# Patient Record
Sex: Male | Born: 1975 | Race: Black or African American | Marital: Married | Smoking: Former smoker
Health system: Southern US, Community
[De-identification: ages and names within clinical notes are randomized; demographics above are authoritative.]

## PROBLEM LIST (undated history)

## (undated) DIAGNOSIS — I639 Cerebral infarction, unspecified: Secondary | ICD-10-CM

## (undated) DIAGNOSIS — E119 Type 2 diabetes mellitus without complications: Secondary | ICD-10-CM

## (undated) HISTORY — PX: WRIST FRACTURE SURGERY: SHX121

## (undated) HISTORY — DX: Type 2 diabetes mellitus without complications: E11.9

## (undated) HISTORY — DX: Cerebral infarction, unspecified: I63.9

---

## 2005-04-07 ENCOUNTER — Emergency Department: Payer: Self-pay | Admitting: Emergency Medicine

## 2005-04-13 ENCOUNTER — Ambulatory Visit: Payer: Self-pay | Admitting: Unknown Physician Specialty

## 2006-08-17 IMAGING — CR DG WRIST COMPLETE 3+V*R*
1 series · 4 of 4 positions shown · non-contrast
Comparison: none

REASON FOR EXAM: injury  pain in wrist   deformity noted   toom 1
COMMENTS:

[Series 1: view not recorded · 0.17mm/px · 4 of 4 slices shown]
[im 1/4]
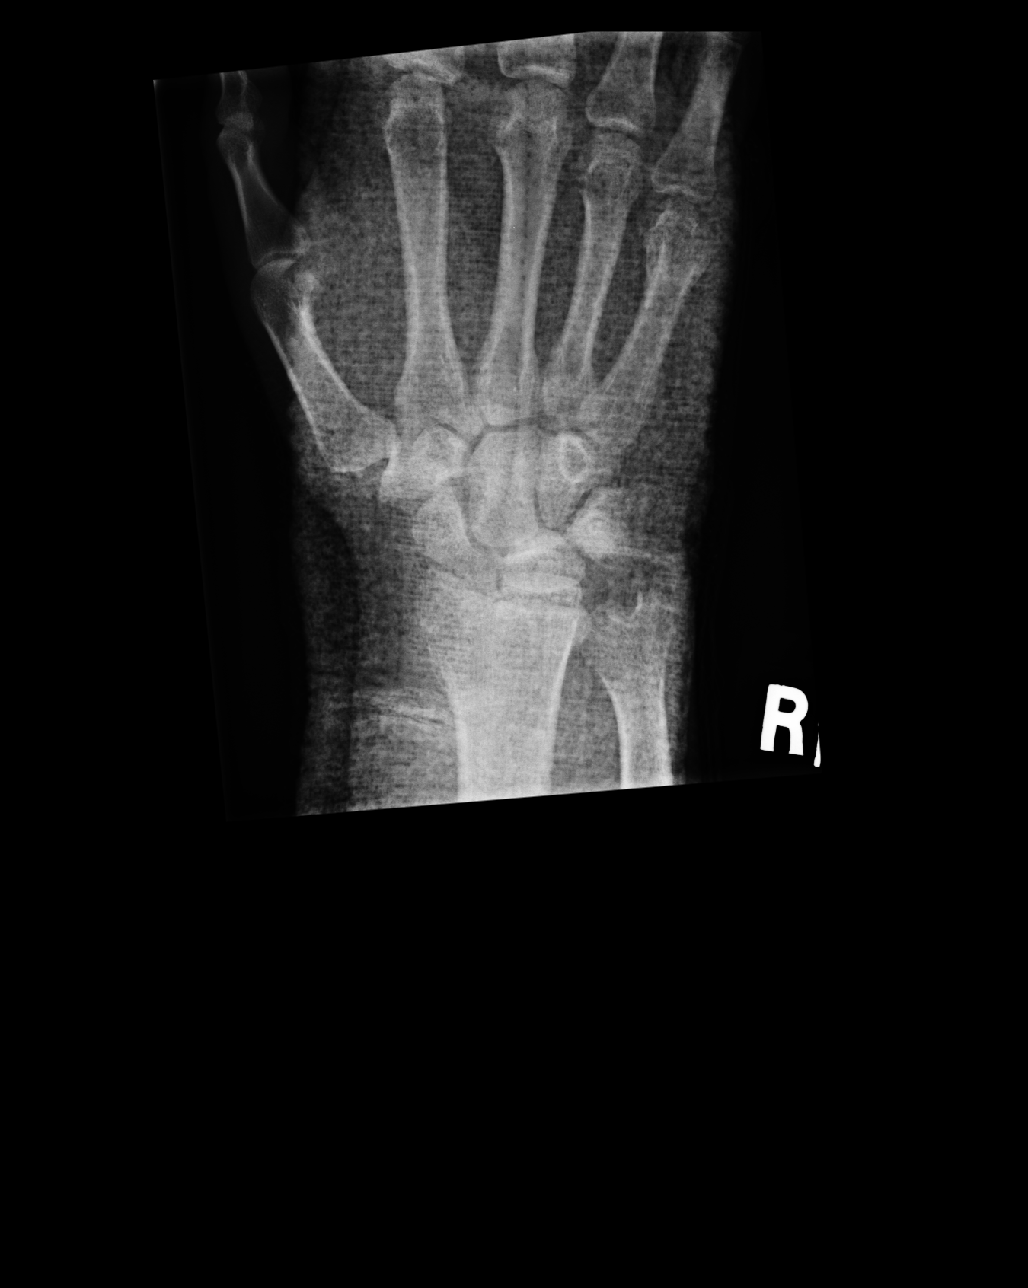
[im 2/4]
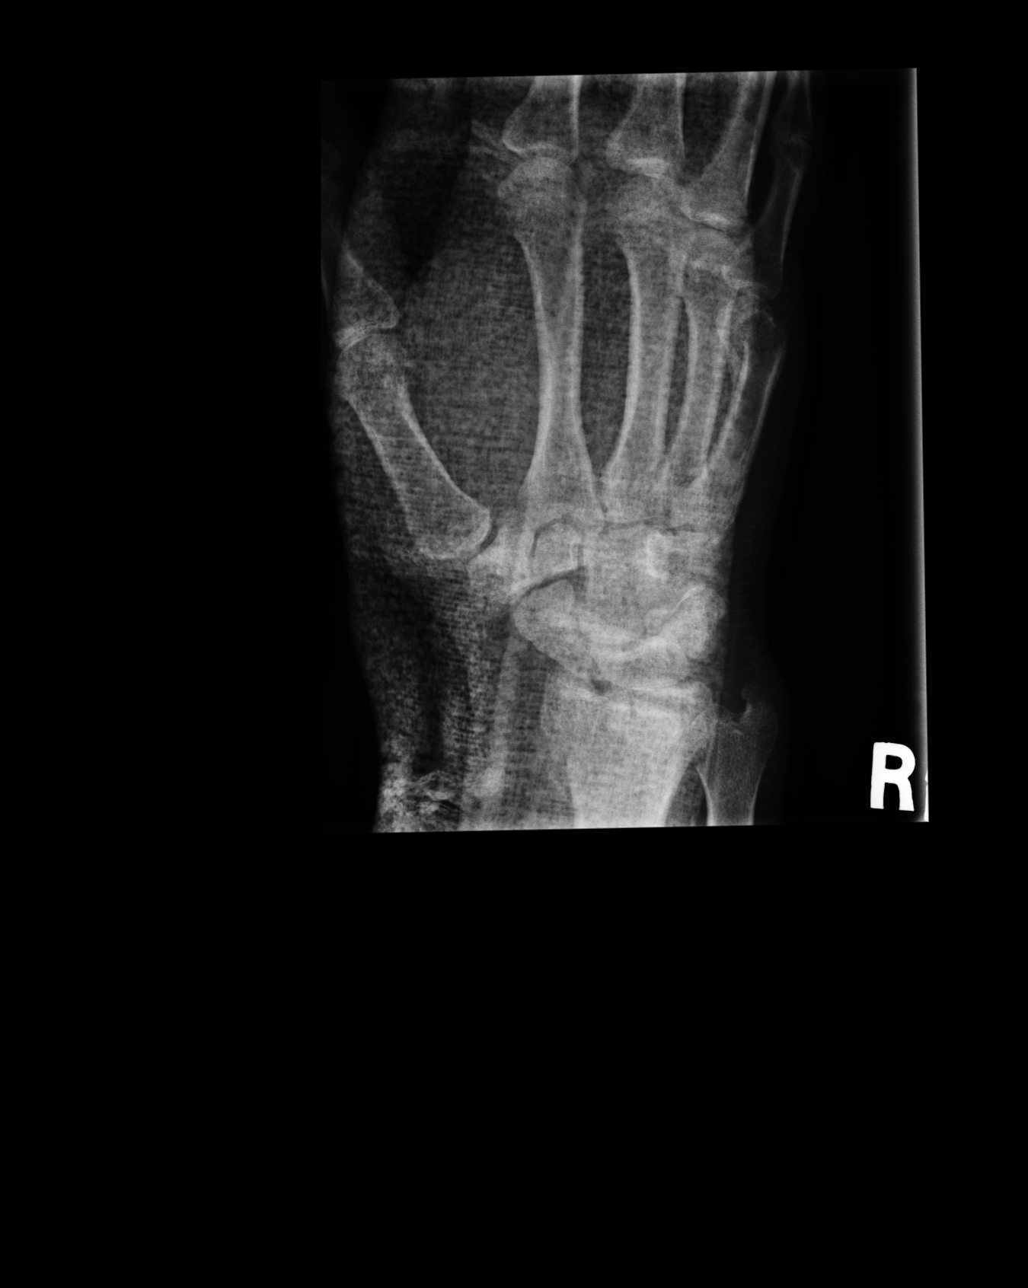
[im 3/4]
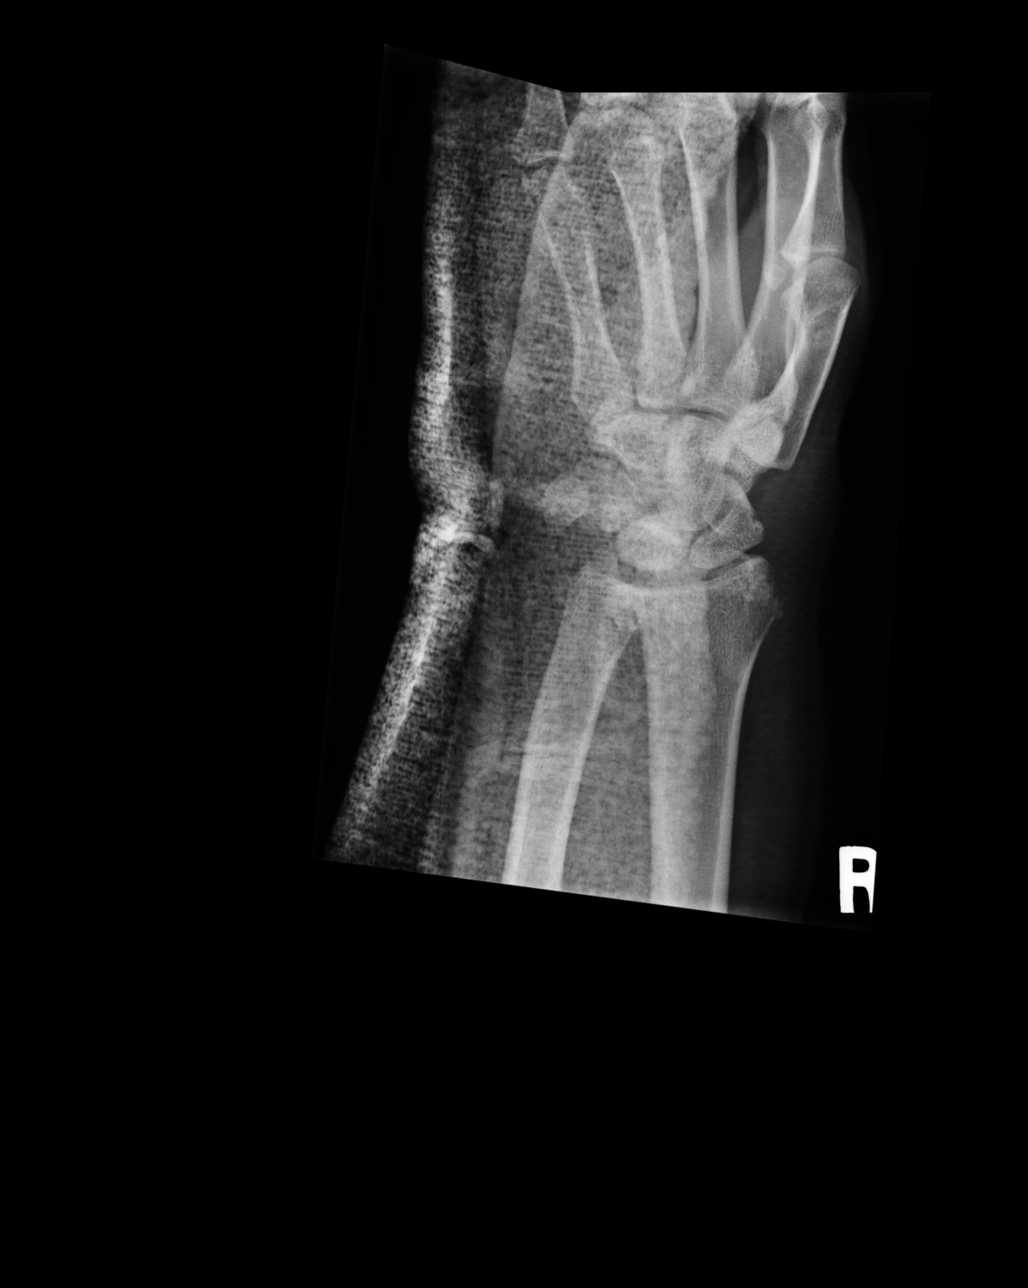
[im 4/4]
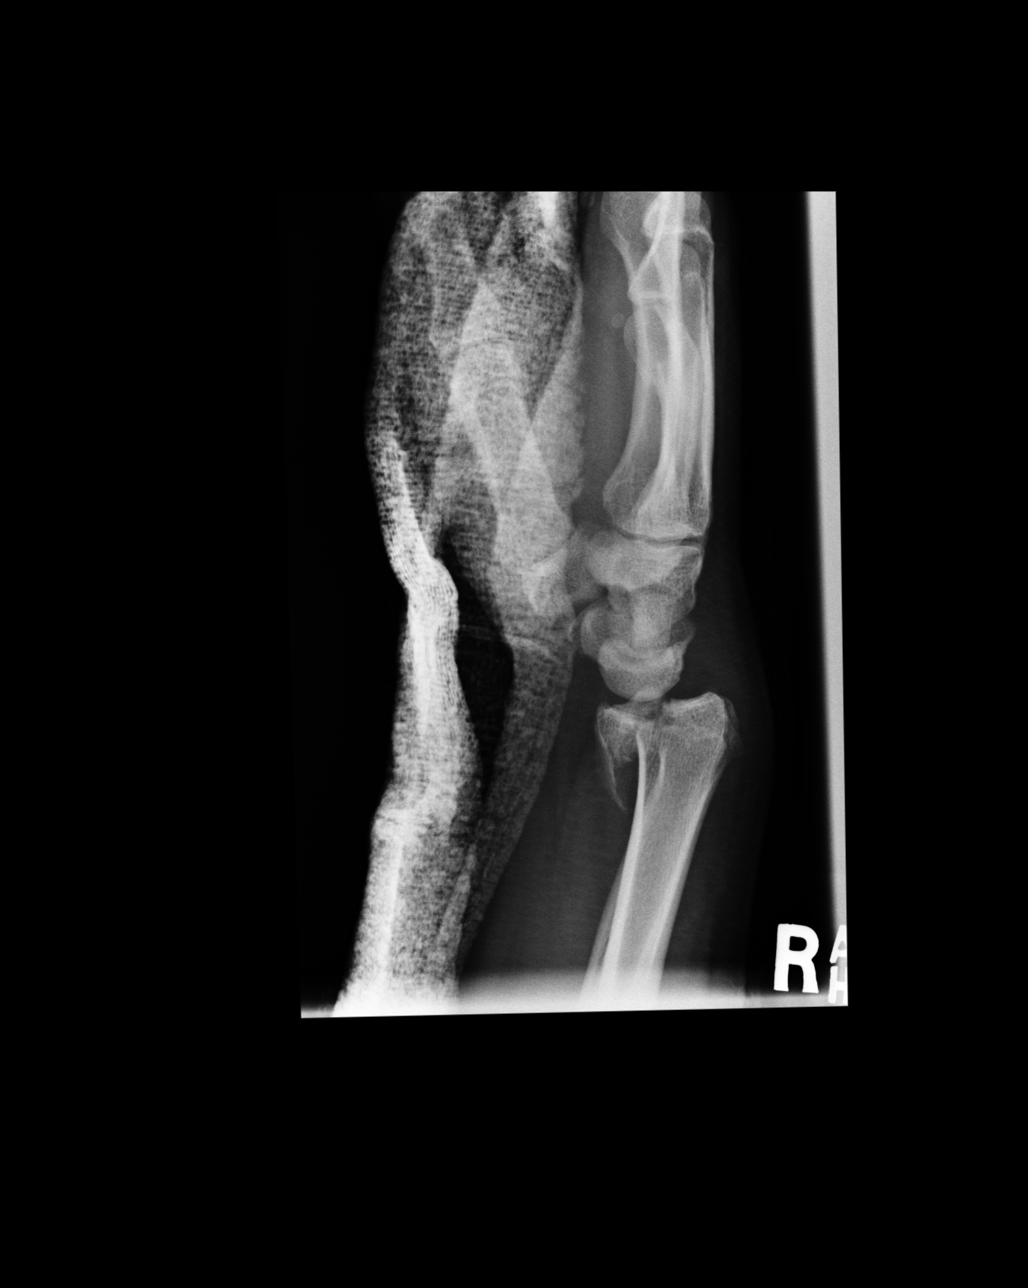

[4 of 4 positions shown; findings below may reference images not displayed]

PROCEDURE:     DXR - DXR WRIST RT COMP WITH OBLIQUES  - April 07, 2005  [DATE]

RESULT:     The patient is in a volar splint. There is a fracture of the
volar aspect of the distal radial epiphysis.  Other associated fractures may
be present, but would be difficult to discern due to the overlying splint.
We can re-image without the splint as needed.  The fracture is displaced and
comminuted.
IMPRESSION: 1)See above.

## 2018-03-05 ENCOUNTER — Other Ambulatory Visit: Payer: Self-pay

## 2018-03-05 ENCOUNTER — Ambulatory Visit (INDEPENDENT_AMBULATORY_CARE_PROVIDER_SITE_OTHER): Payer: BLUE CROSS/BLUE SHIELD | Admitting: Family Medicine

## 2018-03-05 ENCOUNTER — Encounter: Payer: Self-pay | Admitting: Family Medicine

## 2018-03-05 VITALS — BP 117/67 | HR 54 | Temp 98.4°F | Ht 72.0 in | Wt 212.3 lb

## 2018-03-05 DIAGNOSIS — Z23 Encounter for immunization: Secondary | ICD-10-CM

## 2018-03-05 DIAGNOSIS — Z7689 Persons encountering health services in other specified circumstances: Secondary | ICD-10-CM

## 2018-03-05 DIAGNOSIS — Z114 Encounter for screening for human immunodeficiency virus [HIV]: Secondary | ICD-10-CM | POA: Diagnosis not present

## 2018-03-05 DIAGNOSIS — Z113 Encounter for screening for infections with a predominantly sexual mode of transmission: Secondary | ICD-10-CM | POA: Diagnosis not present

## 2018-03-05 DIAGNOSIS — Z0000 Encounter for general adult medical examination without abnormal findings: Secondary | ICD-10-CM | POA: Diagnosis not present

## 2018-03-05 LAB — UA/M W/RFLX CULTURE, ROUTINE
BILIRUBIN UA: NEGATIVE
GLUCOSE, UA: NEGATIVE
KETONES UA: NEGATIVE
Leukocytes, UA: NEGATIVE
Nitrite, UA: NEGATIVE
PROTEIN UA: NEGATIVE
RBC UA: NEGATIVE
SPEC GRAV UA: 1.01 (ref 1.005–1.030)
UUROB: 0.2 mg/dL (ref 0.2–1.0)
pH, UA: 6.5 (ref 5.0–7.5)

## 2018-03-05 NOTE — Progress Notes (Signed)
BP 117/67   Pulse (!) 54   Temp 98.4 F (36.9 C) (Oral)   Ht 6' (1.829 m)   Wt 212 lb 4.8 oz (96.3 kg)   SpO2 99%   BMI 28.79 kg/m    Subjective:    Patient ID: Javier Johnson, male    DOB: February 08, 1976, 42 y.o.   MRN: [id number]  HPI: Javier Johnson is a 42 y.o. male presenting on 03/05/2018 for comprehensive medical examination. Current medical complaints include:see below  Here today to establish care and have a CPE. No known medical problems and not currently on any medications. No concerns today.   Depression Screen done today and results listed below:  [facility] 03/05/2018  Decreased Interest 1  Down, Depressed, Hopeless 1  PHQ - 2 Score 2  Altered sleeping 0  Tired, decreased energy 1  Change in appetite 0  Feeling bad or failure about yourself  1  Trouble concentrating 0  Moving slowly or fidgety/restless 0  Suicidal thoughts 0  PHQ-9 Score 4    The patient does not have a history of falls. I did not complete a risk assessment for falls. A plan of care for falls was not documented.   Past Medical History:  Past Medical History:  Diagnosis Date  . Diabetes mellitus without complication (HCC)   . Stroke [facility](HCC)     Surgical History:  Past Surgical History:  Procedure Laterality Date  . WRIST FRACTURE SURGERY      Medications:  No current outpatient medications on file prior to visit.   No current facility-administered medications on file prior to visit.     Allergies:  Not on File  Social History:  Social History   Socioeconomic History  . Marital status: Unknown    Spouse name: Not on file  . Number of children: Not on file  . Years of education: Not on file  . Highest education level: Not on file  Occupational History  . Not on file  Social Needs  . Financial resource strain: Not on file  . Food insecurity:    Worry: Not on file    Inability: Not on file  . Transportation needs:    Medical: Not on file    Non-medical:  Not on file  Tobacco Use  . Smoking status: Former Games developermoker  . Smokeless tobacco: Never Used  Substance and Sexual Activity  . Alcohol use: Yes    Alcohol/week: 12.0 standard drinks    Types: 12 Shots of liquor per week  . Drug use: Not Currently  . Sexual activity: Yes  Lifestyle  . Physical activity:    Days per week: Not on file    Minutes per session: Not on file  . Stress: Not on file  Relationships  . Social connections:    Talks on phone: Not on file    Gets together: Not on file    Attends religious service: Not on file    Active member of club or organization: Not on file    Attends meetings of clubs or organizations: Not on file    Relationship status: Not on file  . Intimate partner violence:    Fear of current or ex partner: Not on file    Emotionally abused: Not on file    Physically abused: Not on file    Forced sexual activity: Not on file  Other Topics Concern  . Not on file  Social History Narrative  . Not on file  Social History   Tobacco Use  Smoking Status Former Smoker  Smokeless Tobacco Never Used   Social History   Substance and Sexual Activity  Alcohol Use Yes  . Alcohol/week: 12.0 standard drinks  . Types: 12 Shots of liquor per week    Family History:  Family History  Problem Relation Age of Onset  . Diabetes Maternal Grandmother   . Stroke Paternal Grandfather     Past medical history, surgical history, medications, allergies, family history and social history reviewed with patient today and changes made to appropriate areas of the chart.   Review of Systems - General ROS: negative Psychological ROS: negative Ophthalmic ROS: negative ENT ROS: negative Allergy and Immunology ROS: negative Respiratory ROS: no cough, shortness of breath, or wheezing Cardiovascular ROS: no chest pain or dyspnea on exertion Gastrointestinal ROS: no abdominal pain, change in bowel habits, or black or bloody stools Genito-Urinary ROS: no dysuria,  trouble voiding, or hematuria Musculoskeletal ROS: negative Neurological ROS: no TIA or stroke symptoms Dermatological ROS: negative All other ROS negative except what is listed above and in the HPI.      Objective:    BP 117/67   Pulse (!) 54   Temp 98.4 F (36.9 C) (Oral)   Ht 6' (1.829 m)   Wt 212 lb 4.8 oz (96.3 kg)   SpO2 99%   BMI 28.79 kg/m   Wt Readings from Last 3 Encounters:  03/05/18 212 lb 4.8 oz (96.3 kg)    Physical Exam  Constitutional: He is oriented to person, place, and time. He appears well-developed and well-nourished. No distress.  HENT:  Head: Atraumatic.  Right Ear: External ear normal.  Left Ear: External ear normal.  Nose: Nose normal.  Mouth/Throat: Oropharynx is clear and moist.  Eyes: Pupils are equal, round, and reactive to light. Conjunctivae are normal. No scleral icterus.  Neck: Normal range of motion. Neck supple.  Cardiovascular: Normal rate, regular rhythm, normal heart sounds and intact distal pulses.  No murmur heard. Pulmonary/Chest: Effort normal and breath sounds normal. No respiratory distress.  Abdominal: Soft. Bowel sounds are normal. He exhibits no distension and no mass. There is no tenderness. There is no guarding.  Musculoskeletal: Normal range of motion. He exhibits no edema or tenderness.  Neurological: He is alert and oriented to person, place, and time. He has normal reflexes.  Skin: Skin is warm and dry. No rash noted.  Psychiatric: He has a normal mood and affect. His behavior is normal.  Nursing note and vitals reviewed.   Results for orders placed or performed in visit on 03/05/18  HIV antibody  Result Value Ref Range   HIV Screen 4th Generation wRfx Non Reactive Non Reactive  CBC with Differential/Platelet  Result Value Ref Range   WBC 9.4 3.4 - 10.8 x10E3/uL   RBC 4.24 4.14 - 5.80 x10E6/uL   Hemoglobin 13.3 13.0 - 17.7 g/dL   Hematocrit 96.0 45.4 - 51.0 %   MCV 95 79 - 97 fL   MCH 31.4 26.6 - 33.0 pg   MCHC  33.2 31.5 - 35.7 g/dL   RDW 09.8 (L) 11.9 - 14.7 %   Platelets 305 150 - 450 x10E3/uL   Neutrophils 80 Not Estab. %   Lymphs 11 Not Estab. %   Monocytes 8 Not Estab. %   Eos 1 Not Estab. %   Basos 0 Not Estab. %   Neutrophils Absolute 7.5 (H) 1.4 - 7.0 x10E3/uL   Lymphocytes Absolute 1.1 0.7 - 3.1  x10E3/uL   Monocytes Absolute 0.8 0.1 - 0.9 x10E3/uL   EOS (ABSOLUTE) 0.1 0.0 - 0.4 x10E3/uL   Basophils Absolute 0.0 0.0 - 0.2 x10E3/uL   Immature Granulocytes 0 Not Estab. %   Immature Grans (Abs) 0.0 0.0 - 0.1 x10E3/uL  Comprehensive metabolic panel  Result Value Ref Range   Glucose 54 (L) 65 - 99 mg/dL   BUN 14 6 - 24 mg/dL   Creatinine, Ser 1.61 (H) 0.76 - 1.27 mg/dL   GFR calc non Af Amer 69 >59 mL/min/1.73   GFR calc Af Amer 80 >59 mL/min/1.73   BUN/Creatinine Ratio 11 9 - 20   Sodium 139 134 - 144 mmol/L   Potassium 3.5 3.5 - 5.2 mmol/L   Chloride 96 96 - 106 mmol/L   CO2 21 20 - 29 mmol/L   Calcium 9.6 8.7 - 10.2 mg/dL   Total Protein 7.3 6.0 - 8.5 g/dL   Albumin 4.6 3.5 - 5.5 g/dL   Globulin, Total 2.7 1.5 - 4.5 g/dL   Albumin/Globulin Ratio 1.7 1.2 - 2.2   Bilirubin Total 0.8 0.0 - 1.2 mg/dL   Alkaline Phosphatase 54 39 - 117 IU/L   AST 19 0 - 40 IU/L   ALT 16 0 - 44 IU/L  Lipid Panel w/o Chol/HDL Ratio  Result Value Ref Range   Cholesterol, Total 173 100 - 199 mg/dL   Triglycerides 37 0 - 149 mg/dL   HDL 96 >09 mg/dL   VLDL Cholesterol Cal 7 5 - 40 mg/dL   LDL Calculated 70 0 - 99 mg/dL  UA/M w/rflx Culture, Routine  Result Value Ref Range   Specific Gravity, UA 1.010 1.005 - 1.030   pH, UA 6.5 5.0 - 7.5   Color, UA Yellow Yellow   Appearance Ur Clear Clear   Leukocytes, UA Negative Negative   Protein, UA Negative Negative/Trace   Glucose, UA Negative Negative   Ketones, UA Negative Negative   RBC, UA Negative Negative   Bilirubin, UA Negative Negative   Urobilinogen, Ur 0.2 0.2 - 1.0 mg/dL   Nitrite, UA Negative Negative  TSH  Result Value Ref Range    TSH 4.310 0.450 - 4.500 uIU/mL  RPR  Result Value Ref Range   RPR Ser Ql Non Reactive Non Reactive  HSV(herpes simplex vrs) 1+2 ab-IgG  Result Value Ref Range   HSV 1 Glycoprotein G Ab, IgG 9.93 (H) 0.00 - 0.90 index   HSV 2 IgG, Type Spec <0.91 0.00 - 0.90 index      Assessment & Plan:   Problem List Items Addressed This Visit    None    Visit Diagnoses    Annual physical exam    -  Primary   Relevant Orders   CBC with Differential/Platelet (Completed)   Comprehensive metabolic panel (Completed)   Lipid Panel w/o Chol/HDL Ratio (Completed)   UA/M w/rflx Culture, Routine (Completed)   TSH (Completed)   Encounter to establish care       Need for diphtheria-tetanus-pertussis (Tdap) vaccine       Encounter for screening for HIV       Relevant Orders   HIV antibody (Completed)   Routine screening for STI (sexually transmitted infection)       Relevant Orders   RPR (Completed)   HSV(herpes simplex vrs) 1+2 ab-IgG (Completed)   GC/Chlamydia Probe Amp       Discussed aspirin prophylaxis for myocardial infarction prevention and decision was it was not indicated  LABORATORY TESTING:  Health maintenance labs ordered today as discussed above.   The natural history of prostate cancer and ongoing controversy regarding screening and potential treatment outcomes of prostate cancer has been discussed with the patient. The meaning of a false positive PSA and a false negative PSA has been discussed. He indicates understanding of the limitations of this screening test and wishes not to proceed with screening PSA testing.   IMMUNIZATIONS:   - Tdap: Tetanus vaccination status reviewed: postponed. - Influenza: Postponed to flu season  PATIENT COUNSELING:    Sexuality: Discussed sexually transmitted diseases, partner selection, use of condoms, avoidance of unintended pregnancy  and contraceptive alternatives.   Advised to avoid cigarette smoking.  I discussed with the patient that most  people either abstain from alcohol or drink within safe limits (<=14/week and <=4 drinks/occasion for males, <=7/weeks and <= 3 drinks/occasion for females) and that the risk for alcohol disorders and other health effects rises proportionally with the number of drinks per week and how often a drinker exceeds daily limits.  Discussed cessation/primary prevention of drug use and availability of treatment for abuse.   Diet: Encouraged to adjust caloric intake to maintain  or achieve ideal body weight, to reduce intake of dietary saturated fat and total fat, to limit sodium intake by avoiding high sodium foods and not adding table salt, and to maintain adequate dietary potassium and calcium preferably from fresh fruits, vegetables, and low-fat dairy products.    stressed the importance of regular exercise  Injury prevention: Discussed safety belts, safety helmets, smoke detector, smoking near bedding or upholstery.   Dental health: Discussed importance of regular tooth brushing, flossing, and dental visits.   Follow up plan: NEXT PREVENTATIVE PHYSICAL DUE IN 1 YEAR. Return in about 1 year (around 03/06/2019) for CPE.

## 2018-03-05 NOTE — Patient Instructions (Signed)
Tdap Vaccine (Tetanus, Diphtheria and Pertussis): What You Need to Know 1. Why get vaccinated? Tetanus, diphtheria and pertussis are very serious diseases. Tdap vaccine can protect us from these diseases. And, Tdap vaccine given to pregnant women can protect newborn babies against pertussis. TETANUS (Lockjaw) is rare in the [place] today. It causes painful muscle tightening and stiffness, usually all over the body.  It can lead to tightening of muscles in the head and neck so you can't open your mouth, swallow, or sometimes even breathe. Tetanus kills about 1 out of 10 people who are infected even after receiving the best medical care.  DIPHTHERIA is also rare in the [place] today. It can cause a thick coating to form in the back of the throat.  It can lead to breathing problems, heart failure, paralysis, and death.  PERTUSSIS (Whooping Cough) causes severe coughing spells, which can cause difficulty breathing, vomiting and disturbed sleep.  It can also lead to weight loss, incontinence, and rib fractures. Up to 2 in 100 adolescents and 5 in 100 adults with pertussis are hospitalized or have complications, which could include pneumonia or death.  These diseases are caused by bacteria. Diphtheria and pertussis are spread from person to person through secretions from coughing or sneezing. Tetanus enters the body through cuts, scratches, or wounds. Before vaccines, as many as 200,000 cases of diphtheria, 200,000 cases of pertussis, and hundreds of cases of tetanus, were reported in the [place] each year. Since vaccination began, reports of cases for tetanus and diphtheria have dropped by about 99% and for pertussis by about 80%. 2. Tdap vaccine Tdap vaccine can protect adolescents and adults from tetanus, diphtheria, and pertussis. One dose of Tdap is routinely given at age 11 or 12. People who did not get Tdap at that age should get it as soon as possible. Tdap is especially  important for healthcare professionals and anyone having close contact with a baby younger than 12 months. Pregnant women should get a dose of Tdap during every pregnancy, to protect the newborn from pertussis. Infants are most at risk for severe, life-threatening complications from pertussis. Another vaccine, called Td, protects against tetanus and diphtheria, but not pertussis. A Td booster should be given every 10 years. Tdap may be given as one of these boosters if you have never gotten Tdap before. Tdap may also be given after a severe cut or burn to prevent tetanus infection. Your doctor or the person giving you the vaccine can give you more information. Tdap may safely be given at the same time as other vaccines. 3. Some people should not get this vaccine  A person who has ever had a life-threatening allergic reaction after a previous dose of any diphtheria, tetanus or pertussis containing vaccine, OR has a severe allergy to any part of this vaccine, should not get Tdap vaccine. Tell the person giving the vaccine about any severe allergies.  Anyone who had coma or long repeated seizures within 7 days after a childhood dose of DTP or DTaP, or a previous dose of Tdap, should not get Tdap, unless a cause other than the vaccine was found. They can still get Td.  Talk to your doctor if you: ? have seizures or another nervous system problem, ? had severe pain or swelling after any vaccine containing diphtheria, tetanus or pertussis, ? ever had a condition called Guillain-Barr Syndrome (GBS), ? aren't feeling well on the day the shot is scheduled. 4. Risks With any medicine, including   vaccines, there is a chance of side effects. These are usually mild and go away on their own. Serious reactions are also possible but are rare. Most people who get Tdap vaccine do not have any problems with it. Mild problems following Tdap: (Did not interfere with activities)  Pain where the shot was given (about  3 in 4 adolescents or 2 in 3 adults)  Redness or swelling where the shot was given (about 1 person in 5)  Mild fever of at least 100.4F (up to about 1 in 25 adolescents or 1 in 100 adults)  Headache (about 3 or 4 people in 10)  Tiredness (about 1 person in 3 or 4)  Nausea, vomiting, diarrhea, stomach ache (up to 1 in 4 adolescents or 1 in 10 adults)  Chills, sore joints (about 1 person in 10)  Body aches (about 1 person in 3 or 4)  Rash, swollen glands (uncommon)  Moderate problems following Tdap: (Interfered with activities, but did not require medical attention)  Pain where the shot was given (up to 1 in 5 or 6)  Redness or swelling where the shot was given (up to about 1 in 16 adolescents or 1 in 12 adults)  Fever over 102F (about 1 in 100 adolescents or 1 in 250 adults)  Headache (about 1 in 7 adolescents or 1 in 10 adults)  Nausea, vomiting, diarrhea, stomach ache (up to 1 or 3 people in 100)  Swelling of the entire arm where the shot was given (up to about 1 in 500).  Severe problems following Tdap: (Unable to perform usual activities; required medical attention)  Swelling, severe pain, bleeding and redness in the arm where the shot was given (rare).  Problems that could happen after any vaccine:  People sometimes faint after a medical procedure, including vaccination. Sitting or lying down for about 15 minutes can help prevent fainting, and injuries caused by a fall. Tell your doctor if you feel dizzy, or have vision changes or ringing in the ears.  Some people get severe pain in the shoulder and have difficulty moving the arm where a shot was given. This happens very rarely.  Any medication can cause a severe allergic reaction. Such reactions from a vaccine are very rare, estimated at fewer than 1 in a million doses, and would happen within a few minutes to a few hours after the vaccination. As with any medicine, there is a very remote chance of a vaccine  causing a serious injury or death. The safety of vaccines is always being monitored. For more information, visit: www.cdc.gov/vaccinesafety/ 5. What if there is a serious problem? What should I look for? Look for anything that concerns you, such as signs of a severe allergic reaction, very high fever, or unusual behavior. Signs of a severe allergic reaction can include hives, swelling of the face and throat, difficulty breathing, a fast heartbeat, dizziness, and weakness. These would usually start a few minutes to a few hours after the vaccination. What should I do?  If you think it is a severe allergic reaction or other emergency that can't wait, call 9-1-1 or get the person to the nearest hospital. Otherwise, call your doctor.  Afterward, the reaction should be reported to the Vaccine Adverse Event Reporting System (VAERS). Your doctor might file this report, or you can do it yourself through the VAERS web site at www.vaers.hhs.gov, or by calling 1-800-822-7967. ? VAERS does not give medical advice. 6. The National Vaccine Injury Compensation Program The National   Vaccine Injury Compensation Program (VICP) is a federal program that was created to compensate people who may have been injured by certain vaccines. Persons who believe they may have been injured by a vaccine can learn about the program and about filing a claim by calling 1-800-338-2382 or visiting the VICP website at www.hrsa.gov/vaccinecompensation. There is a time limit to file a claim for compensation. 7. How can I learn more?  Ask your doctor. He or she can give you the vaccine package insert or suggest other sources of information.  Call your local or state health department.  Contact the Centers for Disease Control and Prevention (CDC): ? Call 1-800-232-4636 (1-800-CDC-INFO) or ? Visit CDC's website at www.cdc.gov/vaccines CDC Tdap Vaccine VIS (09/06/13) This information is not intended to replace advice given to you by your  health care provider. Make sure you discuss any questions you have with your health care provider. Document Released: 12/30/2011 Document Revised: 03/20/2016 Document Reviewed: 03/20/2016 Elsevier Interactive Patient Education  2017 [employer]  

## 2018-03-06 LAB — COMPREHENSIVE METABOLIC PANEL
A/G RATIO: 1.7 (ref 1.2–2.2)
ALBUMIN: 4.6 g/dL (ref 3.5–5.5)
ALT: 16 IU/L (ref 0–44)
AST: 19 IU/L (ref 0–40)
Alkaline Phosphatase: 54 IU/L (ref 39–117)
BUN / CREAT RATIO: 11 (ref 9–20)
BUN: 14 mg/dL (ref 6–24)
Bilirubin Total: 0.8 mg/dL (ref 0.0–1.2)
CALCIUM: 9.6 mg/dL (ref 8.7–10.2)
CO2: 21 mmol/L (ref 20–29)
CREATININE: 1.29 mg/dL — AB (ref 0.76–1.27)
Chloride: 96 mmol/L (ref 96–106)
GFR, EST AFRICAN AMERICAN: 80 mL/min/{1.73_m2} (ref >59)
GFR, EST NON AFRICAN AMERICAN: 69 mL/min/{1.73_m2} (ref >59)
Globulin, Total: 2.7 g/dL (ref 1.5–4.5)
Glucose: 54 mg/dL — ABNORMAL LOW (ref 65–99)
POTASSIUM: 3.5 mmol/L (ref 3.5–5.2)
SODIUM: 139 mmol/L (ref 134–144)
Total Protein: 7.3 g/dL (ref 6.0–8.5)

## 2018-03-06 LAB — CBC WITH DIFFERENTIAL/PLATELET
Basophils Absolute: 0 10*3/uL (ref 0.0–0.2)
Basos: 0 %
EOS (ABSOLUTE): 0.1 10*3/uL (ref 0.0–0.4)
EOS: 1 %
HEMATOCRIT: 40.1 % (ref 37.5–51.0)
HEMOGLOBIN: 13.3 g/dL (ref 13.0–17.7)
IMMATURE GRANULOCYTES: 0 %
Immature Grans (Abs): 0 10*3/uL (ref 0.0–0.1)
Lymphocytes Absolute: 1.1 10*3/uL (ref 0.7–3.1)
Lymphs: 11 %
MCH: 31.4 pg (ref 26.6–33.0)
MCHC: 33.2 g/dL (ref 31.5–35.7)
MCV: 95 fL (ref 79–97)
MONOCYTES: 8 %
MONOS ABS: 0.8 10*3/uL (ref 0.1–0.9)
NEUTROS PCT: 80 %
Neutrophils Absolute: 7.5 10*3/uL — ABNORMAL HIGH (ref 1.4–7.0)
Platelets: 305 10*3/uL (ref 150–450)
RBC: 4.24 x10E6/uL (ref 4.14–5.80)
RDW: 12 % — AB (ref 12.3–15.4)
WBC: 9.4 10*3/uL (ref 3.4–10.8)

## 2018-03-06 LAB — TSH: TSH: 4.31 u[IU]/mL (ref 0.450–4.500)

## 2018-03-06 LAB — LIPID PANEL W/O CHOL/HDL RATIO
Cholesterol, Total: 173 mg/dL (ref 100–199)
HDL: 96 mg/dL (ref >39)
LDL CALC: 70 mg/dL (ref 0–99)
Triglycerides: 37 mg/dL (ref 0–149)
VLDL Cholesterol Cal: 7 mg/dL (ref 5–40)

## 2018-03-06 LAB — HIV ANTIBODY (ROUTINE TESTING W REFLEX): HIV Screen 4th Generation wRfx: NONREACTIVE

## 2018-03-06 LAB — HSV(HERPES SIMPLEX VRS) I + II AB-IGG: HSV 1 Glycoprotein G Ab, IgG: 9.93 index — ABNORMAL HIGH (ref 0.00–0.90)

## 2018-03-06 LAB — RPR: RPR: NONREACTIVE

## 2018-03-09 LAB — GC/CHLAMYDIA PROBE AMP
Chlamydia trachomatis, NAA: NEGATIVE
NEISSERIA GONORRHOEAE BY PCR: NEGATIVE

## 2019-03-11 ENCOUNTER — Encounter: Payer: Self-pay | Admitting: Family Medicine

## 2019-03-11 ENCOUNTER — Ambulatory Visit (INDEPENDENT_AMBULATORY_CARE_PROVIDER_SITE_OTHER): Payer: BC Managed Care – PPO | Admitting: Family Medicine

## 2019-03-11 ENCOUNTER — Other Ambulatory Visit: Payer: Self-pay

## 2019-03-11 VITALS — BP 111/66 | HR 66 | Temp 98.4°F | Ht 71.5 in | Wt 205.0 lb

## 2019-03-11 DIAGNOSIS — Z23 Encounter for immunization: Secondary | ICD-10-CM

## 2019-03-11 DIAGNOSIS — Z0000 Encounter for general adult medical examination without abnormal findings: Secondary | ICD-10-CM

## 2019-03-11 LAB — UA/M W/RFLX CULTURE, ROUTINE
Bilirubin, UA: NEGATIVE
Glucose, UA: NEGATIVE
Ketones, UA: NEGATIVE
Leukocytes,UA: NEGATIVE
Nitrite, UA: NEGATIVE
Protein,UA: NEGATIVE
RBC, UA: NEGATIVE
Specific Gravity, UA: <1.005 — ABNORMAL LOW (ref 1.005–1.030)
Urobilinogen, Ur: 0.2 mg/dL (ref 0.2–1.0)
pH, UA: 5 (ref 5.0–7.5)

## 2019-03-11 NOTE — Progress Notes (Signed)
BP 111/66   Pulse 66   Temp 98.4 F (36.9 C) (Oral)   Ht 5' 11.5" (1.816 m)   Wt 205 lb (93 kg)   SpO2 98%   BMI 28.19 kg/m    Subjective:    Patient ID: Javier Johnson, male    DOB: 1976/05/04, 43 y.o.   MRN: [id number]  HPI: Javier Johnson is a 43 y.o. male presenting on 03/11/2019 for comprehensive medical examination. Current medical complaints include:none  He currently lives with: Interim Problems from his last visit: no  Depression Screen done today and results listed below:  [facility]  Decreased Interest 1  Down, Depressed, Hopeless 1  PHQ - 2 Score 2  Altered sleeping 0  Tired, decreased energy 1  Change in appetite 0  Feeling bad or failure about yourself  1  Trouble concentrating 0  Moving slowly or fidgety/restless 0  Suicidal thoughts 0  PHQ-9 Score 4    The patient does not have a history of falls. I did not complete a risk assessment for falls. A plan of care for falls was not documented.   Past Medical History:  Past Medical History:  Diagnosis Date  . Diabetes mellitus without complication (HCC)   . Stroke [facility](HCC)     Surgical History:  Past Surgical History:  Procedure Laterality Date  . WRIST FRACTURE SURGERY      Medications:  No current outpatient medications on file prior to visit.   No current facility-administered medications on file prior to visit.     Allergies:  No Known Allergies  Social History:  Social History   Socioeconomic History  . Marital status: Unknown    Spouse name: Not on file  . Number of children: Not on file  . Years of education: Not on file  . Highest education level: Not on file  Occupational History  . Not on file  Social Needs  . Financial resource strain: Not on file  . Food insecurity    Worry: Not on file    Inability: Not on file  . Transportation needs    Medical: Not on file    Non-medical: Not on file  Tobacco Use  . Smoking status: Former Games developermoker  .  Smokeless tobacco: Never Used  Substance and Sexual Activity  . Alcohol use: Not Currently  . Drug use: Not Currently  . Sexual activity: Yes  Lifestyle  . Physical activity    Days per week: Not on file    Minutes per session: Not on file  . Stress: Not on file  Relationships  . Social Musicianconnections    Talks on phone: Not on file    Gets together: Not on file    Attends religious service: Not on file    Active member of club or organization: Not on file    Attends meetings of clubs or organizations: Not on file    Relationship status: Not on file  . Intimate partner violence    Fear of current or ex partner: Not on file    Emotionally abused: Not on file    Physically abused: Not on file    Forced sexual activity: Not on file  Other Topics Concern  . Not on file  Social History Narrative  . Not on file   Social History   Tobacco Use  Smoking Status Former Smoker  Smokeless Tobacco Never Used   Social History   Substance and Sexual Activity  Alcohol Use Not Currently  Family History:  Family History  Problem Relation Age of Onset  . Diabetes Maternal Grandmother   . Stroke Paternal Grandfather     Past medical history, surgical history, medications, allergies, family history and social history reviewed with patient today and changes made to appropriate areas of the chart.   Review of Systems - General ROS: negative Psychological ROS: negative Ophthalmic ROS: negative ENT ROS: negative Allergy and Immunology ROS: negative Hematological and Lymphatic ROS: negative Endocrine ROS: negative Respiratory ROS: no cough, shortness of breath, or wheezing Cardiovascular ROS: no chest pain or dyspnea on exertion Gastrointestinal ROS: no abdominal pain, change in bowel habits, or black or bloody stools Genito-Urinary ROS: no dysuria, trouble voiding, or hematuria Musculoskeletal ROS: negative Neurological ROS: no TIA or stroke symptoms Dermatological ROS: negative  All other ROS negative except what is listed above and in the HPI.      Objective:    BP 111/66   Pulse 66   Temp 98.4 F (36.9 C) (Oral)   Ht 5' 11.5" (1.816 m)   Wt 205 lb (93 kg)   SpO2 98%   BMI 28.19 kg/m   Wt Readings from Last 3 Encounters:  03/11/19 205 lb (93 kg)  03/05/18 212 lb 4.8 oz (96.3 kg)    Physical Exam Vitals signs and nursing note reviewed.  Constitutional:      General: He is not in acute distress.    Appearance: He is well-developed.  HENT:     Head: Atraumatic.     Right Ear: Tympanic membrane and external ear normal.     Left Ear: Tympanic membrane and external ear normal.     Nose: Nose normal.     Mouth/Throat:     Mouth: Mucous membranes are moist.     Pharynx: Oropharynx is clear.  Eyes:     General: No scleral icterus.    Conjunctiva/sclera: Conjunctivae normal.     Pupils: Pupils are equal, round, and reactive to light.  Neck:     Musculoskeletal: Normal range of motion and neck supple.  Cardiovascular:     Rate and Rhythm: Normal rate and regular rhythm.     Heart sounds: Normal heart sounds. No murmur.  Pulmonary:     Effort: Pulmonary effort is normal. No respiratory distress.     Breath sounds: Normal breath sounds.  Abdominal:     General: Bowel sounds are normal. There is no distension.     Palpations: Abdomen is soft. There is no mass.     Tenderness: There is no abdominal tenderness. There is no guarding.  Genitourinary:    Comments: GU exam declined Musculoskeletal: Normal range of motion.        General: No tenderness.  Skin:    General: Skin is warm and dry.     Findings: No rash.  Neurological:     General: No focal deficit present.     Mental Status: He is alert and oriented to person, place, and time.     Deep Tendon Reflexes: Reflexes are normal and symmetric.  Psychiatric:        Mood and Affect: Mood normal.        Behavior: Behavior normal.        Thought Content: Thought content normal.        Judgment:  Judgment normal.     Results for orders placed or performed in visit on 03/05/18  GC/Chlamydia Probe Amp   Specimen: Urine   UR  Result Value Ref Range  Chlamydia trachomatis, NAA Negative Negative   Neisseria gonorrhoeae by PCR Negative Negative  HIV antibody  Result Value Ref Range   HIV Screen 4th Generation wRfx Non Reactive Non Reactive  CBC with Differential/Platelet  Result Value Ref Range   WBC 9.4 3.4 - 10.8 x10E3/uL   RBC 4.24 4.14 - 5.80 x10E6/uL   Hemoglobin 13.3 13.0 - 17.7 g/dL   Hematocrit 40.940.1 81.137.5 - 51.0 %   MCV 95 79 - 97 fL   MCH 31.4 26.6 - 33.0 pg   MCHC 33.2 31.5 - 35.7 g/dL   RDW 91.412.0 (L) 78.212.3 - 95.615.4 %   Platelets 305 150 - 450 x10E3/uL   Neutrophils 80 Not Estab. %   Lymphs 11 Not Estab. %   Monocytes 8 Not Estab. %   Eos 1 Not Estab. %   Basos 0 Not Estab. %   Neutrophils Absolute 7.5 (H) 1.4 - 7.0 x10E3/uL   Lymphocytes Absolute 1.1 0.7 - 3.1 x10E3/uL   Monocytes Absolute 0.8 0.1 - 0.9 x10E3/uL   EOS (ABSOLUTE) 0.1 0.0 - 0.4 x10E3/uL   Basophils Absolute 0.0 0.0 - 0.2 x10E3/uL   Immature Granulocytes 0 Not Estab. %   Immature Grans (Abs) 0.0 0.0 - 0.1 x10E3/uL  Comprehensive metabolic panel  Result Value Ref Range   Glucose 54 (L) 65 - 99 mg/dL   BUN 14 6 - 24 mg/dL   Creatinine, Ser 2.131.29 (H) 0.76 - 1.27 mg/dL   GFR calc non Af Amer 69 >59 mL/min/1.73   GFR calc Af Amer 80 >59 mL/min/1.73   BUN/Creatinine Ratio 11 9 - 20   Sodium 139 134 - 144 mmol/L   Potassium 3.5 3.5 - 5.2 mmol/L   Chloride 96 96 - 106 mmol/L   CO2 21 20 - 29 mmol/L   Calcium 9.6 8.7 - 10.2 mg/dL   Total Protein 7.3 6.0 - 8.5 g/dL   Albumin 4.6 3.5 - 5.5 g/dL   Globulin, Total 2.7 1.5 - 4.5 g/dL   Albumin/Globulin Ratio 1.7 1.2 - 2.2   Bilirubin Total 0.8 0.0 - 1.2 mg/dL   Alkaline Phosphatase 54 39 - 117 IU/L   AST 19 0 - 40 IU/L   ALT 16 0 - 44 IU/L  Lipid Panel w/o Chol/HDL Ratio  Result Value Ref Range   Cholesterol, Total 173 100 - 199 mg/dL    Triglycerides 37 0 - 149 mg/dL   HDL 96 >08>39 mg/dL   VLDL Cholesterol Cal 7 5 - 40 mg/dL   LDL Calculated 70 0 - 99 mg/dL  UA/M w/rflx Culture, Routine   Specimen: Urine   URINE  Result Value Ref Range   Specific Gravity, UA 1.010 1.005 - 1.030   pH, UA 6.5 5.0 - 7.5   Color, UA Yellow Yellow   Appearance Ur Clear Clear   Leukocytes, UA Negative Negative   Protein, UA Negative Negative/Trace   Glucose, UA Negative Negative   Ketones, UA Negative Negative   RBC, UA Negative Negative   Bilirubin, UA Negative Negative   Urobilinogen, Ur 0.2 0.2 - 1.0 mg/dL   Nitrite, UA Negative Negative  TSH  Result Value Ref Range   TSH 4.310 0.450 - 4.500 uIU/mL  RPR  Result Value Ref Range   RPR Ser Ql Non Reactive Non Reactive  HSV(herpes simplex vrs) 1+2 ab-IgG  Result Value Ref Range   HSV 1 Glycoprotein G Ab, IgG 9.93 (H) 0.00 - 0.90 index   HSV 2 IgG, Type Spec <0.91 0.00 -  0.90 index      Assessment & Plan:   Problem List Items Addressed This Visit    None    Visit Diagnoses    Need for Tdap vaccination    -  Primary   Relevant Orders   Tdap vaccine greater than or equal to 7yo IM       Discussed aspirin prophylaxis for myocardial infarction prevention and decision was it was not indicated  LABORATORY TESTING:  Health maintenance labs ordered today as discussed above.   The natural history of prostate cancer and ongoing controversy regarding screening and potential treatment outcomes of prostate cancer has been discussed with the patient. The meaning of a false positive PSA and a false negative PSA has been discussed. He indicates understanding of the limitations of this screening test and wishes not to proceed with screening PSA testing.   IMMUNIZATIONS:   - Tdap: Tetanus vaccination status reviewed: Td vaccination indicated and given today. - Influenza: Postponed to flu season  PATIENT COUNSELING:    Sexuality: Discussed sexually transmitted diseases, partner  selection, use of condoms, avoidance of unintended pregnancy  and contraceptive alternatives.   Advised to avoid cigarette smoking.  I discussed with the patient that most people either abstain from alcohol or drink within safe limits (<=14/week and <=4 drinks/occasion for males, <=7/weeks and <= 3 drinks/occasion for females) and that the risk for alcohol disorders and other health effects rises proportionally with the number of drinks per week and how often a drinker exceeds daily limits.  Discussed cessation/primary prevention of drug use and availability of treatment for abuse.   Diet: Encouraged to adjust caloric intake to maintain  or achieve ideal body weight, to reduce intake of dietary saturated fat and total fat, to limit sodium intake by avoiding high sodium foods and not adding table salt, and to maintain adequate dietary potassium and calcium preferably from fresh fruits, vegetables, and low-fat dairy products.    stressed the importance of regular exercise  Injury prevention: Discussed safety belts, safety helmets, smoke detector, smoking near bedding or upholstery.   Dental health: Discussed importance of regular tooth brushing, flossing, and dental visits.   Follow up plan: NEXT PREVENTATIVE PHYSICAL DUE IN 1 YEAR. No follow-ups on file.

## 2019-03-12 LAB — COMPREHENSIVE METABOLIC PANEL
ALT: 20 IU/L (ref 0–44)
AST: 13 IU/L (ref 0–40)
Albumin/Globulin Ratio: 2 (ref 1.2–2.2)
Albumin: 4.7 g/dL (ref 4.0–5.0)
Alkaline Phosphatase: 46 IU/L (ref 39–117)
BUN/Creatinine Ratio: 12 (ref 9–20)
BUN: 15 mg/dL (ref 6–24)
Bilirubin Total: 0.7 mg/dL (ref 0.0–1.2)
CO2: 23 mmol/L (ref 20–29)
Calcium: 9.3 mg/dL (ref 8.7–10.2)
Chloride: 101 mmol/L (ref 96–106)
Creatinine, Ser: 1.24 mg/dL (ref 0.76–1.27)
GFR calc Af Amer: 83 mL/min/{1.73_m2} (ref >59)
GFR calc non Af Amer: 72 mL/min/{1.73_m2} (ref >59)
Globulin, Total: 2.3 g/dL (ref 1.5–4.5)
Glucose: 72 mg/dL (ref 65–99)
Potassium: 4 mmol/L (ref 3.5–5.2)
Sodium: 140 mmol/L (ref 134–144)
Total Protein: 7 g/dL (ref 6.0–8.5)

## 2019-03-12 LAB — CBC WITH DIFFERENTIAL/PLATELET
Basophils Absolute: 0.1 10*3/uL (ref 0.0–0.2)
Basos: 2 %
EOS (ABSOLUTE): 0.1 10*3/uL (ref 0.0–0.4)
Eos: 1 %
Hematocrit: 40 % (ref 37.5–51.0)
Hemoglobin: 13.4 g/dL (ref 13.0–17.7)
Immature Grans (Abs): 0 10*3/uL (ref 0.0–0.1)
Immature Granulocytes: 0 %
Lymphocytes Absolute: 2.2 10*3/uL (ref 0.7–3.1)
Lymphs: 35 %
MCH: 30.7 pg (ref 26.6–33.0)
MCHC: 33.5 g/dL (ref 31.5–35.7)
MCV: 92 fL (ref 79–97)
Monocytes Absolute: 0.6 10*3/uL (ref 0.1–0.9)
Monocytes: 9 %
Neutrophils Absolute: 3.4 10*3/uL (ref 1.4–7.0)
Neutrophils: 53 %
Platelets: 249 10*3/uL (ref 150–450)
RBC: 4.36 x10E6/uL (ref 4.14–5.80)
RDW: 10.8 % — ABNORMAL LOW (ref 11.6–15.4)
WBC: 6.4 10*3/uL (ref 3.4–10.8)

## 2019-03-12 LAB — LIPID PANEL W/O CHOL/HDL RATIO
Cholesterol, Total: 137 mg/dL (ref 100–199)
HDL: 58 mg/dL (ref >39)
LDL Calculated: 73 mg/dL (ref 0–99)
Triglycerides: 32 mg/dL (ref 0–149)
VLDL Cholesterol Cal: 6 mg/dL (ref 5–40)

## 2019-10-13 ENCOUNTER — Other Ambulatory Visit: Payer: Self-pay

## 2019-10-13 ENCOUNTER — Ambulatory Visit: Payer: Self-pay | Attending: Internal Medicine

## 2019-10-13 DIAGNOSIS — Z23 Encounter for immunization: Secondary | ICD-10-CM

## 2019-10-13 NOTE — Progress Notes (Signed)
   Covid-19 Vaccination Clinic  Name:  Javier Johnson    MRN: [id number] DOB: 1976/03/25  10/13/2019  Mr. Javier Johnson was observed post Covid-19 immunization for 15 minutes without incident. He was provided with Vaccine Information Sheet and instruction to access the V-Safe system.   Mr. Javier Johnson was instructed to call 911 with any severe reactions post vaccine: Marland Kitchen Difficulty breathing  . Swelling of face and throat  . A fast heartbeat  . A bad rash all over body  . Dizziness and weakness   Immunizations Administered    Name Date Dose VIS Date Route   Pfizer COVID-19 Vaccine 10/13/2019  8:47 AM 0.3 mL 06/24/2019 Intramuscular   Manufacturer: [employer], [employer]   Lot: [phone] [id number]   NDC: [address]-3

## 2019-11-08 ENCOUNTER — Ambulatory Visit: Payer: Self-pay | Attending: Internal Medicine

## 2019-11-08 DIAGNOSIS — Z23 Encounter for immunization: Secondary | ICD-10-CM

## 2019-11-08 NOTE — Progress Notes (Signed)
   Covid-19 Vaccination Clinic  Name:  Javier Johnson    MRN: [id number] DOB: [birth year]  11/08/2019  Javier Johnson was observed post Covid-19 immunization for 15 minutes without incident. He was provided with Vaccine Information Sheet and instruction to access the V-Safe system.   Javier Johnson was instructed to call 911 with any severe reactions post vaccine: Marland Kitchen Difficulty breathing  . Swelling of face and throat  . A fast heartbeat  . A bad rash all over body  . Dizziness and weakness   Immunizations Administered    Name Date Dose VIS Date Route   Pfizer COVID-19 Vaccine 11/08/2019  9:02 AM 0.3 mL 09/07/2018 Intramuscular   Manufacturer: [employer], [employer]   Lot: [id number]   NDC: [address]-4

## 2020-03-16 ENCOUNTER — Other Ambulatory Visit: Payer: Self-pay

## 2020-03-16 ENCOUNTER — Ambulatory Visit (INDEPENDENT_AMBULATORY_CARE_PROVIDER_SITE_OTHER): Payer: BC Managed Care – PPO | Admitting: Nurse Practitioner

## 2020-03-16 ENCOUNTER — Encounter: Payer: BC Managed Care – PPO | Admitting: Family Medicine

## 2020-03-16 ENCOUNTER — Encounter: Payer: Self-pay | Admitting: Nurse Practitioner

## 2020-03-16 VITALS — BP 110/67 | HR 50 | Temp 98.3°F | Ht 72.0 in | Wt 205.0 lb

## 2020-03-16 DIAGNOSIS — R079 Chest pain, unspecified: Secondary | ICD-10-CM | POA: Insufficient documentation

## 2020-03-16 DIAGNOSIS — Z0000 Encounter for general adult medical examination without abnormal findings: Secondary | ICD-10-CM

## 2020-03-16 DIAGNOSIS — R0789 Other chest pain: Secondary | ICD-10-CM

## 2020-03-16 DIAGNOSIS — Z1159 Encounter for screening for other viral diseases: Secondary | ICD-10-CM | POA: Diagnosis not present

## 2020-03-16 NOTE — Progress Notes (Signed)
BP 110/67   Pulse (!) 50   Temp 98.3 F (36.8 C) (Oral)   Ht 6' (1.829 m)   Wt 205 lb (93 kg)   SpO2 98%   BMI 27.80 kg/m    Subjective:    Patient ID: Javier StarlingSpencer Ferrington, male    DOB: November 01, 1975, 44 y.o.   MRN: [id number]  HPI: Javier Johnson is a 44 y.o. male presenting on 03/16/2020 for comprehensive medical examination. Current medical complaints include:chest discomfort -- he was made aware of [employer] physical rules and wishes to discuss acute issue today  He currently lives with: significant other and children Interim Problems from his last visit: chest discomfort   CHEST DISCOMFORT Present x one month intermittently.  He is concerned for thoracic outlet syndrome (brought papers he printed from online) -- elliptical and stair climber at gym, works out frequently.  Hand gets numb and tingly, pain runs from anterior left chest wall down arm and into hand.  Notices it to back of shoulder and upper as well.  Has not taken any medications for discomfort.  Was concerned for stress causing this as well, as works out during lunch break and then returns to work which has been stressful lately due to cuts.  Sometimes at night has discomfort going to sleep.  Stretching makes it better.  No HA, slurred speech, loss of function, facial drooping, weakness.  Is working out during times when it presents and denies diaphoresis, other than from working out, or SOB.  No family history of heart attacks or strokes. Recurrent headaches: no Visual changes: no Palpitations: none Dyspnea: no Chest pain: discomfort Lower extremity edema: no Dizzy/lightheaded: no  Functional Status Survey: Is the patient deaf or have difficulty hearing?: No Does the patient have difficulty seeing, even when wearing glasses/contacts?: No Does the patient have difficulty concentrating, remembering, or making decisions?: No Does the patient have difficulty walking or climbing stairs?: No Does the patient have difficulty  dressing or bathing?: No Does the patient have difficulty doing errands alone such as visiting a doctor's office or shopping?: No  FALL RISK: Fall Risk  03/11/2019  Falls in the past year? 0  Number falls in past yr: 0  Injury with Fall? 0    Depression Screen [facility] 03/16/2020 03/11/2019 03/05/2018  Decreased Interest 0 0 1  Down, Depressed, Hopeless 0 0 1  PHQ - 2 Score 0 0 2  Altered sleeping 0 0 0  Tired, decreased energy 0 0 1  Change in appetite 0 0 0  Feeling bad or failure about yourself  0 0 1  Trouble concentrating 0 0 0  Moving slowly or fidgety/restless 0 0 0  Suicidal thoughts 0 0 0  PHQ-9 Score 0 0 4  Difficult doing work/chores Not difficult at all - -    Advanced Directives <no information>  Past Medical History:  Past Medical History:  Diagnosis Date  . Diabetes mellitus without complication (HCC)   . Stroke [facility](HCC)     Surgical History:  Past Surgical History:  Procedure Laterality Date  . WRIST FRACTURE SURGERY      Medications:  No current outpatient medications on file prior to visit.   No current facility-administered medications on file prior to visit.    Allergies:  No Known Allergies  Social History:  Social History   Socioeconomic History  . Marital status: Unknown    Spouse name: Not on file  . Number of children: Not on file  .  Years of education: Not on file  . Highest education level: Not on file  Occupational History  . Not on file  Tobacco Use  . Smoking status: Former Games developer  . Smokeless tobacco: Never Used  Vaping Use  . Vaping Use: Never used  Substance and Sexual Activity  . Alcohol use: Not Currently  . Drug use: Not Currently  . Sexual activity: Yes  Other Topics Concern  . Not on file  Social History Narrative  . Not on file   Social Determinants of Health   Financial Resource Strain:   . Difficulty of Paying Living Expenses: Not on file  Food Insecurity:   . Worried About Patent examiner in the Last Year: Not on file  . Ran Out of Food in the Last Year: Not on file  Transportation Needs:   . Lack of Transportation (Medical): Not on file  . Lack of Transportation (Non-Medical): Not on file  Physical Activity:   . Days of Exercise per Week: Not on file  . Minutes of Exercise per Session: Not on file  Stress:   . Feeling of Stress : Not on file  Social Connections:   . Frequency of Communication with Friends and Family: Not on file  . Frequency of Social Gatherings with Friends and Family: Not on file  . Attends Religious Services: Not on file  . Active Member of Clubs or Organizations: Not on file  . Attends Banker Meetings: Not on file  . Marital Status: Not on file  Intimate Partner Violence:   . Fear of Current or Ex-Partner: Not on file  . Emotionally Abused: Not on file  . Physically Abused: Not on file  . Sexually Abused: Not on file   Social History   Tobacco Use  Smoking Status Former Smoker  Smokeless Tobacco Never Used   Social History   Substance and Sexual Activity  Alcohol Use Not Currently    Family History:  Family History  Problem Relation Age of Onset  . Diabetes Maternal Grandmother   . Stroke Paternal Grandfather     Past medical history, surgical history, medications, allergies, family history and social history reviewed with patient today and changes made to appropriate areas of the chart.   Review of Systems - chest discomfort All other ROS negative except what is listed above and in the HPI.      Objective:    BP 110/67   Pulse (!) 50   Temp 98.3 F (36.8 C) (Oral)   Ht 6' (1.829 m)   Wt 205 lb (93 kg)   SpO2 98%   BMI 27.80 kg/m   Wt Readings from Last 3 Encounters:  03/16/20 205 lb (93 kg)  03/11/19 205 lb (93 kg)  03/05/18 212 lb 4.8 oz (96.3 kg)    Physical Exam Vitals and nursing note reviewed.  Constitutional:      General: He is awake. He is not in acute distress.    Appearance: He  is well-developed and well-groomed. He is not ill-appearing.  HENT:     Head: Normocephalic and atraumatic.     Right Ear: Hearing, tympanic membrane, ear canal and external ear normal. No drainage.     Left Ear: Hearing, tympanic membrane, ear canal and external ear normal. No drainage.     Nose: Nose normal.     Mouth/Throat:     Pharynx: Uvula midline.  Eyes:     General: Lids are normal.  Right eye: No discharge.        Left eye: No discharge.     Extraocular Movements: Extraocular movements intact.     Conjunctiva/sclera: Conjunctivae normal.     Pupils: Pupils are equal, round, and reactive to light.     Visual Fields: Right eye visual fields normal and left eye visual fields normal.  Neck:     Thyroid: No thyromegaly.     Vascular: No carotid bruit or JVD.     Trachea: Trachea normal.  Cardiovascular:     Rate and Rhythm: Regular rhythm. Bradycardia present.     Heart sounds: Normal heart sounds, S1 normal and S2 normal. No murmur heard.  No gallop.      Comments: Apical rate 52 Pulmonary:     Effort: Pulmonary effort is normal. No accessory muscle usage or respiratory distress.     Breath sounds: Normal breath sounds.  Abdominal:     General: Bowel sounds are normal.     Palpations: Abdomen is soft. There is no hepatomegaly or splenomegaly.     Tenderness: There is no abdominal tenderness.  Musculoskeletal:        General: Normal range of motion.     Cervical back: Normal range of motion and neck supple.     Right lower leg: No edema.     Left lower leg: No edema.  Lymphadenopathy:     Head:     Right side of head: No submental, submandibular, tonsillar, preauricular or posterior auricular adenopathy.     Left side of head: No submental, submandibular, tonsillar, preauricular or posterior auricular adenopathy.     Cervical: No cervical adenopathy.  Skin:    General: Skin is warm and dry.     Capillary Refill: Capillary refill takes less than 2 seconds.      Findings: No rash.  Neurological:     Mental Status: He is alert and oriented to person, place, and time.     Cranial Nerves: Cranial nerves are intact.     Gait: Gait is intact.     Deep Tendon Reflexes: Reflexes are normal and symmetric.     Reflex Scores:      Brachioradialis reflexes are 2+ on the right side and 2+ on the left side.      Patellar reflexes are 2+ on the right side and 2+ on the left side. Psychiatric:        Attention and Perception: Attention normal.        Mood and Affect: Mood normal.        Speech: Speech normal.        Behavior: Behavior normal. Behavior is cooperative.        Thought Content: Thought content normal.        Cognition and Memory: Cognition normal.        Judgment: Judgment normal.    EKG in office with sinus bradycardia, rate 50, normal axis.  Results for orders placed or performed in visit on 03/11/19  CBC with Differential/Platelet  Result Value Ref Range   WBC 6.4 3.4 - 10.8 x10E3/uL   RBC 4.36 4.14 - 5.80 x10E6/uL   Hemoglobin 13.4 13.0 - 17.7 g/dL   Hematocrit 63.8 93.7 - 51.0 %   MCV 92 79 - 97 fL   MCH 30.7 26.6 - 33.0 pg   MCHC 33.5 31 - 35 g/dL   RDW 34.2 (L) 87.6 - 81.1 %   Platelets 249 150 - 450 x10E3/uL   Neutrophils 53  Not Estab. %   Lymphs 35 Not Estab. %   Monocytes 9 Not Estab. %   Eos 1 Not Estab. %   Basos 2 Not Estab. %   Neutrophils Absolute 3.4 1 - 7 x10E3/uL   Lymphocytes Absolute 2.2 0 - 3 x10E3/uL   Monocytes Absolute 0.6 0 - 0 x10E3/uL   EOS (ABSOLUTE) 0.1 0.0 - 0.4 x10E3/uL   Basophils Absolute 0.1 0 - 0 x10E3/uL   Immature Granulocytes 0 Not Estab. %   Immature Grans (Abs) 0.0 0.0 - 0.1 x10E3/uL  Comprehensive metabolic panel  Result Value Ref Range   Glucose 72 65 - 99 mg/dL   BUN 15 6 - 24 mg/dL   Creatinine, Ser 4.09 0.76 - 1.27 mg/dL   GFR calc non Af Amer 72 >59 mL/min/1.73   GFR calc Af Amer 83 >59 mL/min/1.73   BUN/Creatinine Ratio 12 9 - 20   Sodium 140 134 - 144 mmol/L   Potassium 4.0  3.5 - 5.2 mmol/L   Chloride 101 96 - 106 mmol/L   CO2 23 20 - 29 mmol/L   Calcium 9.3 8.7 - 10.2 mg/dL   Total Protein 7.0 6.0 - 8.5 g/dL   Albumin 4.7 4.0 - 5.0 g/dL   Globulin, Total 2.3 1.5 - 4.5 g/dL   Albumin/Globulin Ratio 2.0 1.2 - 2.2   Bilirubin Total 0.7 0.0 - 1.2 mg/dL   Alkaline Phosphatase 46 39 - 117 IU/L   AST 13 0 - 40 IU/L   ALT 20 0 - 44 IU/L  Lipid Panel w/o Chol/HDL Ratio  Result Value Ref Range   Cholesterol, Total 137 100 - 199 mg/dL   Triglycerides 32 0 - 149 mg/dL   HDL 58 >81 mg/dL   VLDL Cholesterol Cal 6 5 - 40 mg/dL   LDL Calculated 73 0 - 99 mg/dL  UA/M w/rflx Culture, Routine   Specimen: Urine   URINE  Result Value Ref Range   Specific Gravity, UA <1.005 (L) 1.005 - 1.030   pH, UA 5.0 5.0 - 7.5   Color, UA Yellow Yellow   Appearance Ur Clear Clear   Leukocytes,UA Negative Negative   Protein,UA Negative Negative/Trace   Glucose, UA Negative Negative   Ketones, UA Negative Negative   RBC, UA Negative Negative   Bilirubin, UA Negative Negative   Urobilinogen, Ur 0.2 0.2 - 1.0 mg/dL   Nitrite, UA Negative Negative      Assessment & Plan:   Problem List Items Addressed This Visit      Other   Chest pain    Ongoing, intermittent x 1 month.  EKG reassuring in office today with sinus bradycardia and normal axis, is very active at baseline.  Suspect musculoskeletal in nature.  He does not wish to try muscle relaxer or have referral to PT or ortho.  Recommend he trial Tylenol or Ibuprofen as needed + ensure stretching well prior to strenuous exercise and after.  Check labs today: CBC, CMP, TSH, lipid.  Recommend if any red flag symptoms present or worsening he immediately go to ER or return to office.         Other Visit Diagnoses    Routine general medical examination at a health care facility    -  Primary   Annual labs today to include CBC, CMP, TSH, lipid   Relevant Orders   CBC with Differential/Platelet   Comprehensive metabolic panel    Lipid Panel w/o Chol/HDL Ratio   TSH   Need for  hepatitis C screening test       Hep C screening on labs today   Relevant Orders   Hepatitis C antibody       LABORATORY TESTING:  Health maintenance labs ordered today as discussed above.   IMMUNIZATIONS:   - Tdap: Tetanus vaccination status reviewed: last tetanus booster within 10 years. - Influenza: Refused - Pneumovax: Not applicable - Prevnar: Not applicable - Zostavax vaccine: Not applicable  SCREENING: - Colonoscopy: Not applicable  Discussed with patient purpose of the colonoscopy is to detect colon cancer at curable precancerous or early stages   - AAA Screening: Not applicable  -Hearing Test: Not applicable  -Spirometry: Not applicable   PATIENT COUNSELING:    Sexuality: Discussed sexually transmitted diseases, partner selection, use of condoms, avoidance of unintended pregnancy  and contraceptive alternatives.   Advised to avoid cigarette smoking.  I discussed with the patient that most people either abstain from alcohol or drink within safe limits (<=14/week and <=4 drinks/occasion for males, <=7/weeks and <= 3 drinks/occasion for females) and that the risk for alcohol disorders and other health effects rises proportionally with the number of drinks per week and how often a drinker exceeds daily limits.  Discussed cessation/primary prevention of drug use and availability of treatment for abuse.   Diet: Encouraged to adjust caloric intake to maintain  or achieve ideal body weight, to reduce intake of dietary saturated fat and total fat, to limit sodium intake by avoiding high sodium foods and not adding table salt, and to maintain adequate dietary potassium and calcium preferably from fresh fruits, vegetables, and low-fat dairy products.    stressed the importance of regular exercise  Injury prevention: Discussed safety belts, safety helmets, smoke detector, smoking near bedding or upholstery.   Dental health:  Discussed importance of regular tooth brushing, flossing, and dental visits.   Follow up plan: NEXT PREVENTATIVE PHYSICAL DUE IN 1 YEAR. Return in about 1 year (around 03/16/2021) for Annual physical .

## 2020-03-16 NOTE — Patient Instructions (Signed)

## 2020-03-16 NOTE — Assessment & Plan Note (Addendum)
Ongoing, intermittent x 1 month.  EKG reassuring in office today with sinus bradycardia and normal axis, is very active at baseline.  Suspect musculoskeletal in nature.  He does not wish to try muscle relaxer or have referral to PT or ortho.  Recommend he trial Tylenol or Ibuprofen as needed + ensure stretching well prior to strenuous exercise and after.  Check labs today: CBC, CMP, TSH, lipid.  Recommend if any red flag symptoms present or worsening he immediately go to ER or return to office.

## 2020-03-17 LAB — COMPREHENSIVE METABOLIC PANEL
ALT: 24 IU/L (ref 0–44)
AST: 14 IU/L (ref 0–40)
Albumin/Globulin Ratio: 1.7 (ref 1.2–2.2)
Albumin: 4.6 g/dL (ref 4.0–5.0)
Alkaline Phosphatase: 57 IU/L (ref 48–121)
BUN/Creatinine Ratio: 12 (ref 9–20)
BUN: 16 mg/dL (ref 6–24)
Bilirubin Total: 0.5 mg/dL (ref 0.0–1.2)
CO2: 22 mmol/L (ref 20–29)
Calcium: 9.5 mg/dL (ref 8.7–10.2)
Chloride: 101 mmol/L (ref 96–106)
Creatinine, Ser: 1.36 mg/dL — ABNORMAL HIGH (ref 0.76–1.27)
GFR calc Af Amer: 74 mL/min/{1.73_m2} (ref >59)
GFR calc non Af Amer: 64 mL/min/{1.73_m2} (ref >59)
Globulin, Total: 2.7 g/dL (ref 1.5–4.5)
Glucose: 81 mg/dL (ref 65–99)
Potassium: 4.5 mmol/L (ref 3.5–5.2)
Sodium: 138 mmol/L (ref 134–144)
Total Protein: 7.3 g/dL (ref 6.0–8.5)

## 2020-03-17 LAB — CBC WITH DIFFERENTIAL/PLATELET
Basophils Absolute: 0.1 10*3/uL (ref 0.0–0.2)
Basos: 2 %
EOS (ABSOLUTE): 0.1 10*3/uL (ref 0.0–0.4)
Eos: 2 %
Hematocrit: 40.6 % (ref 37.5–51.0)
Hemoglobin: 14.1 g/dL (ref 13.0–17.7)
Immature Grans (Abs): 0 10*3/uL (ref 0.0–0.1)
Immature Granulocytes: 0 %
Lymphocytes Absolute: 2.3 10*3/uL (ref 0.7–3.1)
Lymphs: 29 %
MCH: 31.9 pg (ref 26.6–33.0)
MCHC: 34.7 g/dL (ref 31.5–35.7)
MCV: 92 fL (ref 79–97)
Monocytes Absolute: 0.8 10*3/uL (ref 0.1–0.9)
Monocytes: 10 %
Neutrophils Absolute: 4.5 10*3/uL (ref 1.4–7.0)
Neutrophils: 57 %
Platelets: 266 10*3/uL (ref 150–450)
RBC: 4.42 x10E6/uL (ref 4.14–5.80)
RDW: 10.9 % — ABNORMAL LOW (ref 11.6–15.4)
WBC: 7.9 10*3/uL (ref 3.4–10.8)

## 2020-03-17 LAB — LIPID PANEL W/O CHOL/HDL RATIO
Cholesterol, Total: 163 mg/dL (ref 100–199)
HDL: 64 mg/dL (ref >39)
LDL Chol Calc (NIH): 88 mg/dL (ref 0–99)
Triglycerides: 51 mg/dL (ref 0–149)
VLDL Cholesterol Cal: 11 mg/dL (ref 5–40)

## 2020-03-17 LAB — TSH: TSH: 3.47 u[IU]/mL (ref 0.450–4.500)

## 2020-03-17 LAB — HEPATITIS C ANTIBODY: Hep C Virus Ab: <0.1 s/co ratio (ref 0.0–0.9)

## 2020-03-19 NOTE — Progress Notes (Signed)
Contacted via MyChart  Good afternoon Javier Johnson, your labs have returned and overall look great.  Hep C is negative.  No anemia on labs noticed.  Thyroid and cholesterol levels are normal.  Any questions? Keep being awesome!!  Thank you for allowing me to participate in your care. Kindest regards, Maybree Riling

## 2021-03-22 ENCOUNTER — Encounter: Payer: Self-pay | Admitting: Nurse Practitioner

## 2021-03-22 ENCOUNTER — Other Ambulatory Visit: Payer: Self-pay

## 2021-03-22 ENCOUNTER — Ambulatory Visit (INDEPENDENT_AMBULATORY_CARE_PROVIDER_SITE_OTHER): Payer: BC Managed Care – PPO | Admitting: Nurse Practitioner

## 2021-03-22 VITALS — BP 107/63 | HR 50 | Temp 99.2°F | Ht 72.0 in | Wt 208.8 lb

## 2021-03-22 DIAGNOSIS — Z0000 Encounter for general adult medical examination without abnormal findings: Secondary | ICD-10-CM

## 2021-03-22 DIAGNOSIS — Z136 Encounter for screening for cardiovascular disorders: Secondary | ICD-10-CM | POA: Diagnosis not present

## 2021-03-22 DIAGNOSIS — Z8249 Family history of ischemic heart disease and other diseases of the circulatory system: Secondary | ICD-10-CM

## 2021-03-22 NOTE — Assessment & Plan Note (Signed)
Check CMP and lipid panel annually -- obtained today.  Recent LDL 88.

## 2021-03-22 NOTE — Patient Instructions (Signed)

## 2021-03-22 NOTE — Progress Notes (Signed)
BP 107/63   Pulse (!) 50 Comment: apical  Temp 99.2 F (37.3 C) (Oral)   Ht 6' (1.829 m)   Wt 208 lb 12.8 oz (94.7 kg)   SpO2 98%   BMI 28.32 kg/m    Subjective:    Patient ID: Javier Johnson, male    DOB: [birth year], 45 y.o.   MRN: [id number]  HPI: Javier Johnson is a 45 y.o. male presenting on 03/22/2021 for comprehensive medical examination. Current medical complaints include:none  He currently lives with: significant other Interim Problems from his last visit: no  FALL RISK: Fall Risk  03/22/2021 03/11/2019  Falls in the past year? 0 0  Number falls in past yr: 0 0  Injury with Fall? 0 0  Risk for fall due to : No Fall Risks -  Follow up Education provided -    Depression Screen Depression screen [facility] 2/9 03/22/2021 03/16/2020 03/11/2019 03/05/2018  Decreased Interest 0 0 0 1  Down, Depressed, Hopeless 0 0 0 1  PHQ - 2 Score 0 0 0 2  Altered sleeping - 0 0 0  Tired, decreased energy - 0 0 1  Change in appetite - 0 0 0  Feeling bad or failure about yourself  - 0 0 1  Trouble concentrating - 0 0 0  Moving slowly or fidgety/restless - 0 0 0  Suicidal thoughts - 0 0 0  PHQ-9 Score - 0 0 4  Difficult doing work/chores - Not difficult at all - -    Advanced Directives <no information>  Past Medical History:  Past Medical History:  Diagnosis Date   Diabetes mellitus without complication (HCC)    Stroke [facility])     Surgical History:  Past Surgical History:  Procedure Laterality Date   WRIST FRACTURE SURGERY      Medications:  No current outpatient medications on file prior to visit.   No current facility-administered medications on file prior to visit.    Allergies:  No Known Allergies  Social History:  Social History   Socioeconomic History   Marital status: Unknown    Spouse name: Not on file   Number of children: Not on file   Years of education: Not on file   Highest education level: Not on file  Occupational History   Not on file  Tobacco Use    Smoking status: Former   Smokeless tobacco: Never  Vaping Use   Vaping Use: Never used  Substance and Sexual Activity   Alcohol use: Not Currently   Drug use: Not Currently   Sexual activity: Yes  Other Topics Concern   Not on file  Social History Narrative   Not on file   Social Determinants of Health   Financial Resource Strain: Low Risk    Difficulty of Paying Living Expenses: Not hard at all  Food Insecurity: No Food Insecurity   Worried About Programme researcher, broadcasting/film/video in the Last Year: Never true   Ran Out of Food in the Last Year: Never true  Transportation Needs: No Transportation Needs   Lack of Transportation (Medical): No   Lack of Transportation (Non-Medical): No  Physical Activity: Sufficiently Active   Days of Exercise per Week: 5 days   Minutes of Exercise per Session: 50 min  Stress: No Stress Concern Present   Feeling of Stress : Not at all  Social Connections: Moderately Isolated   Frequency of Communication with Friends and Family: More than three times a week   Frequency of Social  Gatherings with Friends and Family: More than three times a week   Attends Religious Services: Never   Database administrator or Organizations: No   Attends Engineer, structural: Never   Marital Status: Married  Catering manager Violence: Not At Risk   Fear of Current or Ex-Partner: No   Emotionally Abused: No   Physically Abused: No   Sexually Abused: No   Social History   Tobacco Use  Smoking Status Former  Smokeless Tobacco Never   Social History   Substance and Sexual Activity  Alcohol Use Not Currently    Family History:  Family History  Problem Relation Age of Onset   Diabetes Maternal Grandmother    Stroke Paternal Grandfather     Past medical history, surgical history, medications, allergies, family history and social history reviewed with patient today and changes made to appropriate areas of the chart.   ROS All other ROS negative except what is  listed above and in the HPI.      Objective:    BP 107/63   Pulse (!) 50 Comment: apical  Temp 99.2 F (37.3 C) (Oral)   Ht 6' (1.829 m)   Wt 208 lb 12.8 oz (94.7 kg)   SpO2 98%   BMI 28.32 kg/m   Wt Readings from Last 3 Encounters:  03/22/21 208 lb 12.8 oz (94.7 kg)  03/16/20 205 lb (93 kg)  03/11/19 205 lb (93 kg)    Physical Exam Vitals and nursing note reviewed.  Constitutional:      General: He is awake. He is not in acute distress.    Appearance: He is well-developed, well-groomed and overweight. He is not ill-appearing or toxic-appearing.  HENT:     Head: Normocephalic and atraumatic.     Right Ear: Hearing, tympanic membrane, ear canal and external ear normal. No drainage.     Left Ear: Hearing, tympanic membrane, ear canal and external ear normal. No drainage.     Nose: Nose normal.     Mouth/Throat:     Pharynx: Uvula midline.  Eyes:     General: Lids are normal.        Right eye: No discharge.        Left eye: No discharge.     Extraocular Movements: Extraocular movements intact.     Conjunctiva/sclera: Conjunctivae normal.     Pupils: Pupils are equal, round, and reactive to light.     Visual Fields: Right eye visual fields normal and left eye visual fields normal.  Neck:     Thyroid: No thyromegaly.     Vascular: No carotid bruit or JVD.     Trachea: Trachea normal.  Cardiovascular:     Rate and Rhythm: Normal rate and regular rhythm.     Heart sounds: Normal heart sounds, S1 normal and S2 normal. No murmur heard.   No gallop.  Pulmonary:     Effort: Pulmonary effort is normal. No accessory muscle usage or respiratory distress.     Breath sounds: Normal breath sounds.  Abdominal:     General: Bowel sounds are normal.     Palpations: Abdomen is soft. There is no hepatomegaly or splenomegaly.     Tenderness: There is no abdominal tenderness.  Musculoskeletal:        General: Normal range of motion.     Cervical back: Normal range of motion and neck  supple.     Right lower leg: No edema.     Left lower leg: No  edema.  Lymphadenopathy:     Head:     Right side of head: No submental, submandibular, tonsillar, preauricular or posterior auricular adenopathy.     Left side of head: No submental, submandibular, tonsillar, preauricular or posterior auricular adenopathy.     Cervical: No cervical adenopathy.  Skin:    General: Skin is warm and dry.     Capillary Refill: Capillary refill takes less than 2 seconds.     Findings: No rash.  Neurological:     Mental Status: He is alert and oriented to person, place, and time.     Cranial Nerves: Cranial nerves are intact.     Gait: Gait is intact.     Deep Tendon Reflexes: Reflexes are normal and symmetric.     Reflex Scores:      Brachioradialis reflexes are 2+ on the right side and 2+ on the left side.      Patellar reflexes are 2+ on the right side and 2+ on the left side. Psychiatric:        Attention and Perception: Attention normal.        Mood and Affect: Mood normal.        Speech: Speech normal.        Behavior: Behavior normal. Behavior is cooperative.        Thought Content: Thought content normal.        Cognition and Memory: Cognition normal.        Judgment: Judgment normal.   Results for orders placed or performed in visit on 03/16/20  Hepatitis C antibody  Result Value Ref Range   Hep C Virus Ab <0.1 0.0 - 0.9 s/co ratio  CBC with Differential/Platelet  Result Value Ref Range   WBC 7.9 3.4 - 10.8 x10E3/uL   RBC 4.42 4.14 - 5.80 x10E6/uL   Hemoglobin 14.1 13.0 - 17.7 g/dL   Hematocrit 24.2 68.3 - 51.0 %   MCV 92 79 - 97 fL   MCH 31.9 26.6 - 33.0 pg   MCHC 34.7 31.5 - 35.7 g/dL   RDW 41.9 (L) 62.2 - 29.7 %   Platelets 266 150 - 450 x10E3/uL   Neutrophils 57 Not Estab. %   Lymphs 29 Not Estab. %   Monocytes 10 Not Estab. %   Eos 2 Not Estab. %   Basos 2 Not Estab. %   Neutrophils Absolute 4.5 1.4 - 7.0 x10E3/uL   Lymphocytes Absolute 2.3 0.7 - 3.1 x10E3/uL    Monocytes Absolute 0.8 0.1 - 0.9 x10E3/uL   EOS (ABSOLUTE) 0.1 0.0 - 0.4 x10E3/uL   Basophils Absolute 0.1 0.0 - 0.2 x10E3/uL   Immature Granulocytes 0 Not Estab. %   Immature Grans (Abs) 0.0 0.0 - 0.1 x10E3/uL  Comprehensive metabolic panel  Result Value Ref Range   Glucose 81 65 - 99 mg/dL   BUN 16 6 - 24 mg/dL   Creatinine, Ser 9.89 (H) 0.76 - 1.27 mg/dL   GFR calc non Af Amer 64 >59 mL/min/1.73   GFR calc Af Amer 74 >59 mL/min/1.73   BUN/Creatinine Ratio 12 9 - 20   Sodium 138 134 - 144 mmol/L   Potassium 4.5 3.5 - 5.2 mmol/L   Chloride 101 96 - 106 mmol/L   CO2 22 20 - 29 mmol/L   Calcium 9.5 8.7 - 10.2 mg/dL   Total Protein 7.3 6.0 - 8.5 g/dL   Albumin 4.6 4.0 - 5.0 g/dL   Globulin, Total 2.7 1.5 - 4.5 g/dL   Albumin/Globulin  Ratio 1.7 1.2 - 2.2   Bilirubin Total 0.5 0.0 - 1.2 mg/dL   Alkaline Phosphatase 57 48 - 121 IU/L   AST 14 0 - 40 IU/L   ALT 24 0 - 44 IU/L  Lipid Panel w/o Chol/HDL Ratio  Result Value Ref Range   Cholesterol, Total 163 100 - 199 mg/dL   Triglycerides 51 0 - 149 mg/dL   HDL 64 >40>39 mg/dL   VLDL Cholesterol Cal 11 5 - 40 mg/dL   LDL Chol Calc (NIH) 88 0 - 99 mg/dL  TSH  Result Value Ref Range   TSH 3.470 0.450 - 4.500 uIU/mL      Assessment & Plan:   Problem List Items Addressed This Visit       Other   Family history of cardiac disorder - Primary    Check CMP and lipid panel annually -- obtained today.  Recent LDL 88.      Relevant Orders   Comprehensive metabolic panel   Lipid Panel w/o Chol/HDL Ratio   TSH   Other Visit Diagnoses     Healthy adult on routine physical examination       Annual labs today to include CBC, CMP, TSH, lipid.  Health maintenance reviewed, refuses flu vaccine.   Relevant Orders   CBC with Differential/Platelet   Comprehensive metabolic panel   Lipid Panel w/o Chol/HDL Ratio   TSH        LABORATORY TESTING:  Health maintenance labs ordered today as discussed above.   IMMUNIZATIONS:   - Tdap:  Tetanus vaccination status reviewed: last tetanus booster within 10 years. - Influenza: Refused - Pneumovax: Not applicable - Prevnar: Not applicable - Zostavax vaccine: Not applicable  SCREENING: - Colonoscopy: Not applicable  Discussed with patient purpose of the colonoscopy is to detect colon cancer at curable precancerous or early stages   - AAA Screening: Not applicable  -Hearing Test: Not applicable  -Spirometry: Not applicable   PATIENT COUNSELING:    Sexuality: Discussed sexually transmitted diseases, partner selection, use of condoms, avoidance of unintended pregnancy  and contraceptive alternatives.   Advised to avoid cigarette smoking.  I discussed with the patient that most people either abstain from alcohol or drink within safe limits (<=14/week and <=4 drinks/occasion for males, <=7/weeks and <= 3 drinks/occasion for females) and that the risk for alcohol disorders and other health effects rises proportionally with the number of drinks per week and how often a drinker exceeds daily limits.  Discussed cessation/primary prevention of drug use and availability of treatment for abuse.   Diet: Encouraged to adjust caloric intake to maintain  or achieve ideal body weight, to reduce intake of dietary saturated fat and total fat, to limit sodium intake by avoiding high sodium foods and not adding table salt, and to maintain adequate dietary potassium and calcium preferably from fresh fruits, vegetables, and low-fat dairy products.    Stressed the importance of regular exercise  Injury prevention: Discussed safety belts, safety helmets, smoke detector, smoking near bedding or upholstery.   Dental health: Discussed importance of regular tooth brushing, flossing, and dental visits.   Follow up plan: NEXT PREVENTATIVE PHYSICAL DUE IN 1 YEAR. Return in about 1 year (around 03/22/2022) for Annual physical.

## 2021-03-23 LAB — COMPREHENSIVE METABOLIC PANEL
ALT: 18 IU/L (ref 0–44)
AST: 15 IU/L (ref 0–40)
Albumin/Globulin Ratio: 2.1 (ref 1.2–2.2)
Albumin: 4.7 g/dL (ref 4.0–5.0)
Alkaline Phosphatase: 48 IU/L (ref 44–121)
BUN/Creatinine Ratio: 10 (ref 9–20)
BUN: 11 mg/dL (ref 6–24)
Bilirubin Total: 0.7 mg/dL (ref 0.0–1.2)
CO2: 22 mmol/L (ref 20–29)
Calcium: 9.3 mg/dL (ref 8.7–10.2)
Chloride: 98 mmol/L (ref 96–106)
Creatinine, Ser: 1.11 mg/dL (ref 0.76–1.27)
Globulin, Total: 2.2 g/dL (ref 1.5–4.5)
Glucose: 76 mg/dL (ref 65–99)
Potassium: 4.3 mmol/L (ref 3.5–5.2)
Sodium: 136 mmol/L (ref 134–144)
Total Protein: 6.9 g/dL (ref 6.0–8.5)
eGFR: 84 mL/min/{1.73_m2} (ref >59)

## 2021-03-23 LAB — CBC WITH DIFFERENTIAL/PLATELET
Basophils Absolute: 0.1 10*3/uL (ref 0.0–0.2)
Basos: 1 %
EOS (ABSOLUTE): 0.1 10*3/uL (ref 0.0–0.4)
Eos: 1 %
Hematocrit: 39.7 % (ref 37.5–51.0)
Hemoglobin: 12.9 g/dL — ABNORMAL LOW (ref 13.0–17.7)
Immature Grans (Abs): 0 10*3/uL (ref 0.0–0.1)
Immature Granulocytes: 0 %
Lymphocytes Absolute: 2 10*3/uL (ref 0.7–3.1)
Lymphs: 27 %
MCH: 30.9 pg (ref 26.6–33.0)
MCHC: 32.5 g/dL (ref 31.5–35.7)
MCV: 95 fL (ref 79–97)
Monocytes Absolute: 0.7 10*3/uL (ref 0.1–0.9)
Monocytes: 9 %
Neutrophils Absolute: 4.5 10*3/uL (ref 1.4–7.0)
Neutrophils: 62 %
Platelets: 231 10*3/uL (ref 150–450)
RBC: 4.18 x10E6/uL (ref 4.14–5.80)
RDW: 10.8 % — ABNORMAL LOW (ref 11.6–15.4)
WBC: 7.4 10*3/uL (ref 3.4–10.8)

## 2021-03-23 LAB — LIPID PANEL W/O CHOL/HDL RATIO
Cholesterol, Total: 145 mg/dL (ref 100–199)
HDL: 62 mg/dL (ref >39)
LDL Chol Calc (NIH): 75 mg/dL (ref 0–99)
Triglycerides: 30 mg/dL (ref 0–149)
VLDL Cholesterol Cal: 8 mg/dL (ref 5–40)

## 2021-03-23 LAB — TSH: TSH: 3.85 u[IU]/mL (ref 0.450–4.500)

## 2021-03-23 NOTE — Progress Notes (Signed)
Contacted via MyChart   Good evening Javier Johnson, your labs have returned and overall remain stable.  No concerns noted.  If any questions let me know.:) Keep being amazing!!  Thank you for allowing me to participate in your care.  I appreciate you. Kindest regards, Jeffrie Lofstrom

## 2022-03-28 ENCOUNTER — Encounter: Payer: BC Managed Care – PPO | Admitting: Nurse Practitioner

## 2022-05-01 NOTE — Progress Notes (Signed)
BP 108/75   Pulse (!) 40   Temp 98 F (36.7 C) (Oral)   Ht 6' 1.62" (1.87 m)   Wt 205 lb 9.6 oz (93.3 kg)   SpO2 92%   BMI 26.67 kg/m    Subjective:    Patient ID: Javier Johnson, male    DOB: Dec 06, 1975, 46 y.o.   MRN: [id number]  HPI: Javier Johnson is a 46 y.o. male presenting on 05/02/2022 for comprehensive medical examination. Current medical complaints include:none  He currently lives with: significant other Interim Problems from his last visit: no  FALL RISK:    03/22/2021    1:18 PM 03/11/2019    2:54 PM  [place] in the past year? 0 0  Number falls in past yr: 0 0  Injury with Fall? 0 0  Risk for fall due to : No Fall Risks   Follow up Education provided     Depression Screen    03/22/2021    1:14 PM 03/16/2020    2:13 PM 03/11/2019    2:54 PM 03/05/2018    2:11 PM  Depression screen PHQ 2/9  Decreased Interest 0 0 0 1  Down, Depressed, Hopeless 0 0 0 1  PHQ - 2 Score 0 0 0 2  Altered sleeping  0 0 0  Tired, decreased energy  0 0 1  Change in appetite  0 0 0  Feeling bad or failure about yourself   0 0 1  Trouble concentrating  0 0 0  Moving slowly or fidgety/restless  0 0 0  Suicidal thoughts  0 0 0  PHQ-9 Score  0 0 4  Difficult doing work/chores  Not difficult at all      Advanced Directives <no information>  Past Medical History:  Past Medical History:  Diagnosis Date   Diabetes mellitus without complication ([place])    Stroke [facility])     Surgical History:  Past Surgical History:  Procedure Laterality Date   WRIST FRACTURE SURGERY      Medications:  No current outpatient medications on file prior to visit.   No current facility-administered medications on file prior to visit.    Allergies:  No Known Allergies  Social History:  Social History   Socioeconomic History   Marital status: Married    Spouse name: Not on file   Number of children: Not on file   Years of education: Not on file   Highest education level: Not on  file  Occupational History   Not on file  Tobacco Use   Smoking status: Former   Smokeless tobacco: Never  Vaping Use   Vaping Use: Never used  Substance and Sexual Activity   Alcohol use: Not Currently   Drug use: Not Currently   Sexual activity: Yes  Other Topics Concern   Not on file  Social History Narrative   Not on file   Social Determinants of Health   Financial Resource Strain: Low Risk  (03/22/2021)   Overall Financial Resource Strain (CARDIA)    Difficulty of Paying Living Expenses: Not hard at all  Food Insecurity: No Food Insecurity (03/22/2021)   Hunger Vital Sign    Worried About Running Out of Food in the Last Year: Never true    [place] in the Last Year: Never true  Transportation Needs: No Transportation Needs (03/22/2021)   PRAPARE - Hydrologist (Medical): No    Lack of Transportation (Non-Medical): No  Physical Activity: Sufficiently Active (03/22/2021)   Exercise Vital Sign    Days of Exercise per Week: 5 days    Minutes of Exercise per Session: 50 min  Stress: No Stress Concern Present (03/22/2021)   [place]    Feeling of Stress : Not at all  Social Connections: Moderately Isolated (03/22/2021)   Social Connection and Isolation Panel [NHANES]    Frequency of Communication with Friends and Family: More than three times a week    Frequency of Social Gatherings with Friends and Family: More than three times a week    Attends Religious Services: Never    Marine scientist or Organizations: No    Attends Archivist Meetings: Never    Marital Status: Married  Human resources officer Violence: Not At Risk (03/22/2021)   Humiliation, Afraid, Rape, and Kick questionnaire    Fear of Current or Ex-Partner: No    Emotionally Abused: No    Physically Abused: No    Sexually Abused: No   Social History   Tobacco Use  Smoking Status Former  Smokeless Tobacco  Never   Social History   Substance and Sexual Activity  Alcohol Use Not Currently    Family History:  Family History  Problem Relation Age of Onset   Diabetes Maternal Grandmother    Stroke Paternal Grandfather     Past medical history, surgical history, medications, allergies, family history and social history reviewed with patient today and changes made to appropriate areas of the chart.   Review of Systems  Eyes:  Negative for blurred vision and double vision.  Respiratory:  Negative for shortness of breath.   Cardiovascular:  Negative for chest pain, palpitations and leg swelling.  Neurological:  Negative for dizziness and headaches.   All other ROS negative except what is listed above and in the HPI.      Objective:    BP 108/75   Pulse (!) 40   Temp 98 F (36.7 C) (Oral)   Ht 6' 1.62" (1.87 m)   Wt 205 lb 9.6 oz (93.3 kg)   SpO2 92%   BMI 26.67 kg/m   Wt Readings from Last 3 Encounters:  05/02/22 205 lb 9.6 oz (93.3 kg)  03/22/21 208 lb 12.8 oz (94.7 kg)  03/16/20 205 lb (93 kg)    Physical Exam Vitals and nursing note reviewed.  Constitutional:      General: He is awake. He is not in acute distress.    Appearance: Normal appearance. He is well-developed, well-groomed and normal weight. He is not ill-appearing or toxic-appearing.  HENT:     Head: Normocephalic and atraumatic.     Right Ear: Hearing, tympanic membrane, ear canal and external ear normal. No drainage.     Left Ear: Hearing, tympanic membrane, ear canal and external ear normal. No drainage.     Nose: Nose normal.     Mouth/Throat:     Pharynx: Uvula midline.  Eyes:     General: Lids are normal.        Right eye: No discharge.        Left eye: No discharge.     Extraocular Movements: Extraocular movements intact.     Conjunctiva/sclera: Conjunctivae normal.     Pupils: Pupils are equal, round, and reactive to light.     Visual Fields: Right eye visual fields normal and left eye visual  fields normal.  Neck:     Thyroid: No thyromegaly.  Vascular: No carotid bruit or JVD.     Trachea: Trachea normal.  Cardiovascular:     Rate and Rhythm: Normal rate and regular rhythm.     Heart sounds: Normal heart sounds, S1 normal and S2 normal. No murmur heard.    No gallop.  Pulmonary:     Effort: Pulmonary effort is normal. No accessory muscle usage or respiratory distress.     Breath sounds: Normal breath sounds.  Abdominal:     General: Bowel sounds are normal.     Palpations: Abdomen is soft. There is no hepatomegaly or splenomegaly.     Tenderness: There is no abdominal tenderness.  Musculoskeletal:        General: Normal range of motion.     Cervical back: Normal range of motion and neck supple.     Right lower leg: No edema.     Left lower leg: No edema.  Lymphadenopathy:     Head:     Right side of head: No submental, submandibular, tonsillar, preauricular or posterior auricular adenopathy.     Left side of head: No submental, submandibular, tonsillar, preauricular or posterior auricular adenopathy.     Cervical: No cervical adenopathy.  Skin:    General: Skin is warm and dry.     Capillary Refill: Capillary refill takes less than 2 seconds.     Findings: No rash.  Neurological:     Mental Status: He is alert and oriented to person, place, and time.     Gait: Gait is intact.     Deep Tendon Reflexes: Reflexes are normal and symmetric.     Reflex Scores:      Brachioradialis reflexes are 2+ on the right side and 2+ on the left side.      Patellar reflexes are 2+ on the right side and 2+ on the left side. Psychiatric:        Attention and Perception: Attention normal.        Mood and Affect: Mood normal.        Speech: Speech normal.        Behavior: Behavior normal. Behavior is cooperative.        Thought Content: Thought content normal.        Cognition and Memory: Cognition normal.        Judgment: Judgment normal.    Results for orders placed or  performed in visit on 03/22/21  CBC with Differential/Platelet  Result Value Ref Range   WBC 7.4 3.4 - 10.8 x10E3/uL   RBC 4.18 4.14 - 5.80 x10E6/uL   Hemoglobin 12.9 (L) 13.0 - 17.7 g/dL   Hematocrit 39.7 37.5 - 51.0 %   MCV 95 79 - 97 fL   MCH 30.9 26.6 - 33.0 pg   MCHC 32.5 31.5 - 35.7 g/dL   RDW 10.8 (L) 11.6 - 15.4 %   Platelets 231 150 - 450 x10E3/uL   Neutrophils 62 Not Estab. %   Lymphs 27 Not Estab. %   Monocytes 9 Not Estab. %   Eos 1 Not Estab. %   Basos 1 Not Estab. %   Neutrophils Absolute 4.5 1.4 - 7.0 x10E3/uL   Lymphocytes Absolute 2.0 0.7 - 3.1 x10E3/uL   Monocytes Absolute 0.7 0.1 - 0.9 x10E3/uL   EOS (ABSOLUTE) 0.1 0.0 - 0.4 x10E3/uL   Basophils Absolute 0.1 0.0 - 0.2 x10E3/uL   Immature Granulocytes 0 Not Estab. %   Immature Grans (Abs) 0.0 0.0 - 0.1 x10E3/uL  Comprehensive metabolic panel  Result Value Ref Range   Glucose 76 65 - 99 mg/dL   BUN 11 6 - 24 mg/dL   Creatinine, Ser 1.11 0.76 - 1.27 mg/dL   eGFR 84 >59 mL/min/1.73   BUN/Creatinine Ratio 10 9 - 20   Sodium 136 134 - 144 mmol/L   Potassium 4.3 3.5 - 5.2 mmol/L   Chloride 98 96 - 106 mmol/L   CO2 22 20 - 29 mmol/L   Calcium 9.3 8.7 - 10.2 mg/dL   Total Protein 6.9 6.0 - 8.5 g/dL   Albumin 4.7 4.0 - 5.0 g/dL   Globulin, Total 2.2 1.5 - 4.5 g/dL   Albumin/Globulin Ratio 2.1 1.2 - 2.2   Bilirubin Total 0.7 0.0 - 1.2 mg/dL   Alkaline Phosphatase 48 44 - 121 IU/L   AST 15 0 - 40 IU/L   ALT 18 0 - 44 IU/L  Lipid Panel w/o Chol/HDL Ratio  Result Value Ref Range   Cholesterol, Total 145 100 - 199 mg/dL   Triglycerides 30 0 - 149 mg/dL   HDL 62 >39 mg/dL   VLDL Cholesterol Cal 8 5 - 40 mg/dL   LDL Chol Calc (NIH) 75 0 - 99 mg/dL  TSH  Result Value Ref Range   TSH 3.850 0.450 - 4.500 uIU/mL      Assessment & Plan:   Problem List Items Addressed This Visit   None Visit Diagnoses     Annual physical exam    -  Primary   Screening for ischemic heart disease            LABORATORY  TESTING:  Health maintenance labs ordered today as discussed above.   IMMUNIZATIONS:   - Tdap: Tetanus vaccination status reviewed: last tetanus booster within 10 years. - Influenza: Refused - Pneumovax: Not applicable - Prevnar: Not applicable - Zostavax vaccine: Not applicable  SCREENING: - Colonoscopy: Not applicable  Discussed with patient purpose of the colonoscopy is to detect colon cancer at curable precancerous or early stages   - AAA Screening: Not applicable  -Hearing Test: Not applicable  -Spirometry: Not applicable   PATIENT COUNSELING:    Sexuality: Discussed sexually transmitted diseases, partner selection, use of condoms, avoidance of unintended pregnancy  and contraceptive alternatives.   Advised to avoid cigarette smoking.  I discussed with the patient that most people either abstain from alcohol or drink within safe limits (<=14/week and <=4 drinks/occasion for males, <=7/weeks and <= 3 drinks/occasion for females) and that the risk for alcohol disorders and other health effects rises proportionally with the number of drinks per week and how often a drinker exceeds daily limits.  Discussed cessation/primary prevention of drug use and availability of treatment for abuse.   Diet: Encouraged to adjust caloric intake to maintain  or achieve ideal body weight, to reduce intake of dietary saturated fat and total fat, to limit sodium intake by avoiding high sodium foods and not adding table salt, and to maintain adequate dietary potassium and calcium preferably from fresh fruits, vegetables, and low-fat dairy products.    Stressed the importance of regular exercise  Injury prevention: Discussed safety belts, safety helmets, smoke detector, smoking near bedding or upholstery.   Dental health: Discussed importance of regular tooth brushing, flossing, and dental visits.   Follow up plan: NEXT PREVENTATIVE PHYSICAL DUE IN 1 YEAR. No follow-ups on file.

## 2022-05-02 ENCOUNTER — Ambulatory Visit (INDEPENDENT_AMBULATORY_CARE_PROVIDER_SITE_OTHER): Payer: BC Managed Care – PPO | Admitting: Nurse Practitioner

## 2022-05-02 ENCOUNTER — Encounter: Payer: Self-pay | Admitting: Nurse Practitioner

## 2022-05-02 VITALS — BP 108/75 | HR 40 | Temp 98.0°F | Ht 73.62 in | Wt 205.6 lb

## 2022-05-02 DIAGNOSIS — Z0000 Encounter for general adult medical examination without abnormal findings: Secondary | ICD-10-CM

## 2022-05-02 DIAGNOSIS — Z136 Encounter for screening for cardiovascular disorders: Secondary | ICD-10-CM | POA: Diagnosis not present

## 2022-05-02 LAB — URINALYSIS, ROUTINE W REFLEX MICROSCOPIC
Bilirubin, UA: NEGATIVE
Glucose, UA: NEGATIVE
Ketones, UA: NEGATIVE
Leukocytes,UA: NEGATIVE
Nitrite, UA: NEGATIVE
Protein,UA: NEGATIVE
RBC, UA: NEGATIVE
Specific Gravity, UA: 1.01 (ref 1.005–1.030)
Urobilinogen, Ur: 0.2 mg/dL (ref 0.2–1.0)
pH, UA: 6 (ref 5.0–7.5)

## 2022-05-03 LAB — CBC WITH DIFFERENTIAL/PLATELET
Basophils Absolute: 0.1 10*3/uL (ref 0.0–0.2)
Basos: 2 %
EOS (ABSOLUTE): 0.1 10*3/uL (ref 0.0–0.4)
Eos: 2 %
Hematocrit: 41.6 % (ref 37.5–51.0)
Hemoglobin: 14 g/dL (ref 13.0–17.7)
Immature Grans (Abs): 0 10*3/uL (ref 0.0–0.1)
Immature Granulocytes: 0 %
Lymphocytes Absolute: 2.4 10*3/uL (ref 0.7–3.1)
Lymphs: 37 %
MCH: 31.4 pg (ref 26.6–33.0)
MCHC: 33.7 g/dL (ref 31.5–35.7)
MCV: 93 fL (ref 79–97)
Monocytes Absolute: 0.6 10*3/uL (ref 0.1–0.9)
Monocytes: 10 %
Neutrophils Absolute: 3.1 10*3/uL (ref 1.4–7.0)
Neutrophils: 49 %
Platelets: 253 10*3/uL (ref 150–450)
RBC: 4.46 x10E6/uL (ref 4.14–5.80)
RDW: 10.9 % — ABNORMAL LOW (ref 11.6–15.4)
WBC: 6.4 10*3/uL (ref 3.4–10.8)

## 2022-05-03 LAB — COMPREHENSIVE METABOLIC PANEL
ALT: 18 IU/L (ref 0–44)
AST: 13 IU/L (ref 0–40)
Albumin/Globulin Ratio: 2 (ref 1.2–2.2)
Albumin: 4.8 g/dL (ref 4.1–5.1)
Alkaline Phosphatase: 54 IU/L (ref 44–121)
BUN/Creatinine Ratio: 12 (ref 9–20)
BUN: 15 mg/dL (ref 6–24)
Bilirubin Total: 0.5 mg/dL (ref 0.0–1.2)
CO2: 24 mmol/L (ref 20–29)
Calcium: 9.9 mg/dL (ref 8.7–10.2)
Chloride: 99 mmol/L (ref 96–106)
Creatinine, Ser: 1.22 mg/dL (ref 0.76–1.27)
Globulin, Total: 2.4 g/dL (ref 1.5–4.5)
Glucose: 81 mg/dL (ref 70–99)
Potassium: 4.7 mmol/L (ref 3.5–5.2)
Sodium: 139 mmol/L (ref 134–144)
Total Protein: 7.2 g/dL (ref 6.0–8.5)
eGFR: 75 mL/min/{1.73_m2} (ref >59)

## 2022-05-03 LAB — LIPID PANEL
Chol/HDL Ratio: 2.9 ratio (ref 0.0–5.0)
Cholesterol, Total: 194 mg/dL (ref 100–199)
HDL: 68 mg/dL (ref >39)
LDL Chol Calc (NIH): 123 mg/dL — ABNORMAL HIGH (ref 0–99)
Triglycerides: 14 mg/dL (ref 0–149)
VLDL Cholesterol Cal: 3 mg/dL — ABNORMAL LOW (ref 5–40)

## 2022-05-03 LAB — TSH: TSH: 4.16 u[IU]/mL (ref 0.450–4.500)

## 2022-05-05 NOTE — Progress Notes (Signed)
Hi Dandy. It was nice to see you last week.  Your lab work looks good.  Your cholesterol is elevated.  I recommend a low fat diet and exercise.  No other concerns at this time. Continue with your current medication regimen.  Follow up as discussed.  Please let me know if you have any questions.

## 2023-05-08 ENCOUNTER — Encounter: Payer: BC Managed Care – PPO | Admitting: Nurse Practitioner
